# Patient Record
Sex: Female | Born: 1935 | Race: White | Hispanic: No | Smoking: Never smoker
Health system: Southern US, Community
[De-identification: ages and names within clinical notes are randomized; demographics above are authoritative.]

## PROBLEM LIST (undated history)

## (undated) DIAGNOSIS — Z9889 Other specified postprocedural states: Secondary | ICD-10-CM

## (undated) DIAGNOSIS — I10 Essential (primary) hypertension: Secondary | ICD-10-CM

## (undated) DIAGNOSIS — IMO0002 Reserved for concepts with insufficient information to code with codable children: Secondary | ICD-10-CM

## (undated) DIAGNOSIS — C50912 Malignant neoplasm of unspecified site of left female breast: Secondary | ICD-10-CM

## (undated) DIAGNOSIS — M1990 Unspecified osteoarthritis, unspecified site: Secondary | ICD-10-CM

## (undated) DIAGNOSIS — M8580 Other specified disorders of bone density and structure, unspecified site: Secondary | ICD-10-CM

## (undated) DIAGNOSIS — K219 Gastro-esophageal reflux disease without esophagitis: Secondary | ICD-10-CM

## (undated) DIAGNOSIS — IMO0001 Reserved for inherently not codable concepts without codable children: Secondary | ICD-10-CM

## (undated) DIAGNOSIS — F419 Anxiety disorder, unspecified: Secondary | ICD-10-CM

## (undated) DIAGNOSIS — R112 Nausea with vomiting, unspecified: Secondary | ICD-10-CM

## (undated) DIAGNOSIS — C50911 Malignant neoplasm of unspecified site of right female breast: Secondary | ICD-10-CM

## (undated) DIAGNOSIS — E785 Hyperlipidemia, unspecified: Secondary | ICD-10-CM

## (undated) DIAGNOSIS — N189 Chronic kidney disease, unspecified: Secondary | ICD-10-CM

## (undated) HISTORY — PX: ABDOMINAL HYSTERECTOMY: SHX81

## (undated) HISTORY — PX: APPENDECTOMY: SHX54

## (undated) HISTORY — DX: Essential (primary) hypertension: I10

## (undated) HISTORY — DX: Reserved for concepts with insufficient information to code with codable children: IMO0002

## (undated) HISTORY — DX: Hyperlipidemia, unspecified: E78.5

## (undated) HISTORY — DX: Reserved for inherently not codable concepts without codable children: IMO0001

---

## 1998-01-20 ENCOUNTER — Ambulatory Visit (HOSPITAL_COMMUNITY): Admission: RE | Admit: 1998-01-20 | Discharge: 1998-01-20 | Payer: Self-pay | Admitting: Cardiology

## 1998-06-30 ENCOUNTER — Ambulatory Visit (HOSPITAL_COMMUNITY): Admission: RE | Admit: 1998-06-30 | Discharge: 1998-06-30 | Payer: Self-pay | Admitting: *Deleted

## 1998-12-22 ENCOUNTER — Other Ambulatory Visit: Admission: RE | Admit: 1998-12-22 | Discharge: 1998-12-22 | Payer: Self-pay | Admitting: Obstetrics and Gynecology

## 2000-01-11 ENCOUNTER — Other Ambulatory Visit: Admission: RE | Admit: 2000-01-11 | Discharge: 2000-01-11 | Payer: Self-pay | Admitting: Obstetrics and Gynecology

## 2001-09-29 ENCOUNTER — Other Ambulatory Visit: Admission: RE | Admit: 2001-09-29 | Discharge: 2001-09-29 | Payer: Self-pay | Admitting: Obstetrics and Gynecology

## 2002-09-18 ENCOUNTER — Ambulatory Visit (HOSPITAL_COMMUNITY): Admission: RE | Admit: 2002-09-18 | Discharge: 2002-09-18 | Payer: Self-pay | Admitting: *Deleted

## 2003-11-30 ENCOUNTER — Ambulatory Visit (HOSPITAL_COMMUNITY): Admission: RE | Admit: 2003-11-30 | Discharge: 2003-11-30 | Payer: Self-pay | Admitting: Ophthalmology

## 2004-09-15 ENCOUNTER — Encounter (INDEPENDENT_AMBULATORY_CARE_PROVIDER_SITE_OTHER): Payer: Self-pay | Admitting: *Deleted

## 2004-09-15 ENCOUNTER — Ambulatory Visit (HOSPITAL_COMMUNITY): Admission: RE | Admit: 2004-09-15 | Discharge: 2004-09-15 | Payer: Self-pay | Admitting: *Deleted

## 2005-03-12 ENCOUNTER — Encounter: Admission: RE | Admit: 2005-03-12 | Discharge: 2005-03-12 | Payer: Self-pay | Admitting: Internal Medicine

## 2009-09-17 HISTORY — PX: BREAST SURGERY: SHX581

## 2010-03-23 ENCOUNTER — Encounter: Admission: RE | Admit: 2010-03-23 | Discharge: 2010-03-23 | Payer: Self-pay | Admitting: Diagnostic Radiology

## 2010-05-03 ENCOUNTER — Ambulatory Visit (HOSPITAL_COMMUNITY): Admission: RE | Admit: 2010-05-03 | Discharge: 2010-05-03 | Payer: Self-pay | Admitting: General Surgery

## 2010-05-05 ENCOUNTER — Telehealth: Payer: Self-pay

## 2010-05-12 ENCOUNTER — Ambulatory Visit: Payer: Self-pay | Admitting: Oncology

## 2010-05-31 ENCOUNTER — Ambulatory Visit
Admission: RE | Admit: 2010-05-31 | Discharge: 2010-07-13 | Payer: Self-pay | Source: Home / Self Care | Admitting: Radiation Oncology

## 2010-07-11 ENCOUNTER — Ambulatory Visit: Payer: Self-pay | Admitting: Genetic Counselor

## 2010-08-24 ENCOUNTER — Ambulatory Visit: Payer: Self-pay | Admitting: Genetic Counselor

## 2010-08-25 ENCOUNTER — Ambulatory Visit: Payer: Self-pay | Admitting: Oncology

## 2010-08-28 ENCOUNTER — Ambulatory Visit: Payer: Self-pay | Admitting: Cardiology

## 2010-09-20 LAB — CBC WITH DIFFERENTIAL/PLATELET
BASO%: 0.5 % (ref 0.0–2.0)
Basophils Absolute: 0 10*3/uL (ref 0.0–0.1)
EOS%: 1.8 % (ref 0.0–7.0)
Eosinophils Absolute: 0.1 10*3/uL (ref 0.0–0.5)
HCT: 34.7 % — ABNORMAL LOW (ref 34.8–46.6)
HGB: 12.3 g/dL (ref 11.6–15.9)
LYMPH%: 24.4 % (ref 14.0–49.7)
MCH: 34.4 pg — ABNORMAL HIGH (ref 25.1–34.0)
MCHC: 35.5 g/dL (ref 31.5–36.0)
MCV: 96.7 fL (ref 79.5–101.0)
MONO#: 0.5 10*3/uL (ref 0.1–0.9)
MONO%: 7.8 % (ref 0.0–14.0)
NEUT#: 3.9 10*3/uL (ref 1.5–6.5)
NEUT%: 65.5 % (ref 38.4–76.8)
Platelets: 208 10*3/uL (ref 145–400)
RBC: 3.59 10*6/uL — ABNORMAL LOW (ref 3.70–5.45)
RDW: 12.3 % (ref 11.2–14.5)
WBC: 5.9 10*3/uL (ref 3.9–10.3)
lymph#: 1.4 10*3/uL (ref 0.9–3.3)

## 2010-09-20 LAB — COMPREHENSIVE METABOLIC PANEL
ALT: 23 U/L (ref 0–35)
AST: 21 U/L (ref 0–37)
Albumin: 3.9 g/dL (ref 3.5–5.2)
Alkaline Phosphatase: 81 U/L (ref 39–117)
BUN: 15 mg/dL (ref 6–23)
CO2: 29 mEq/L (ref 19–32)
Calcium: 9.9 mg/dL (ref 8.4–10.5)
Chloride: 98 mEq/L (ref 96–112)
Creatinine, Ser: 1.28 mg/dL — ABNORMAL HIGH (ref 0.40–1.20)
Glucose, Bld: 130 mg/dL — ABNORMAL HIGH (ref 70–99)
Potassium: 4.1 mEq/L (ref 3.5–5.3)
Sodium: 136 mEq/L (ref 135–145)
Total Bilirubin: 0.9 mg/dL (ref 0.3–1.2)
Total Protein: 7 g/dL (ref 6.0–8.3)

## 2010-12-01 LAB — COMPREHENSIVE METABOLIC PANEL
AST: 22 U/L (ref 0–37)
Albumin: 4.2 g/dL (ref 3.5–5.2)
BUN: 13 mg/dL (ref 6–23)
Calcium: 9.9 mg/dL (ref 8.4–10.5)
Creatinine, Ser: 1.16 mg/dL (ref 0.4–1.2)
GFR calc Af Amer: 55 mL/min — ABNORMAL LOW (ref >60)
Total Bilirubin: 0.9 mg/dL (ref 0.3–1.2)
Total Protein: 7 g/dL (ref 6.0–8.3)

## 2010-12-01 LAB — DIFFERENTIAL
Basophils Absolute: 0 10*3/uL (ref 0.0–0.1)
Lymphocytes Relative: 37 % (ref 12–46)
Lymphs Abs: 2.4 10*3/uL (ref 0.7–4.0)
Monocytes Absolute: 0.5 10*3/uL (ref 0.1–1.0)
Monocytes Relative: 8 % (ref 3–12)
Neutro Abs: 3.5 10*3/uL (ref 1.7–7.7)

## 2010-12-01 LAB — CBC
MCH: 32.5 pg (ref 26.0–34.0)
MCHC: 34.3 g/dL (ref 30.0–36.0)
MCV: 94.6 fL (ref 78.0–100.0)
Platelets: 188 10*3/uL (ref 150–400)
RDW: 12.2 % (ref 11.5–15.5)

## 2010-12-17 ENCOUNTER — Other Ambulatory Visit: Payer: Self-pay | Admitting: Cardiology

## 2010-12-17 DIAGNOSIS — I10 Essential (primary) hypertension: Secondary | ICD-10-CM

## 2011-02-02 NOTE — Op Note (Signed)
   NAME:  Tammy Oneill, Tammy Oneill                            ACCOUNT NO.:  [id number]   MEDICAL RECORD NO.:  [id number]                   PATIENT TYPE:  AMB   LOCATION:  ENDO                                 FACILITY:  [facility]   PHYSICIAN:  Georgiana Spinner, M.D.                 DATE OF BIRTH:  1932/12/01   DATE OF PROCEDURE:  DATE OF DISCHARGE:                                 OPERATIVE REPORT   PROCEDURE:  Upper endoscopy with dilation.   INDICATIONS FOR PROCEDURE:  Dysphagia.   ANESTHESIA:  Demerol 50, Versed 6 mg.   DESCRIPTION OF PROCEDURE:  With the patient mildly sedated in the left  lateral decubitus position, the Olympus videoscopic endoscope was inserted  in the mouth and passed under direct vision through the esophagus and there  was a question of a stricture. We entered into the stomach. The fundus,  body, antrum, duodenal bulb and second portion of the duodenum all appeared  normal. From this point, the endoscope was slowly withdrawn taking  circumferential views of the entire duodenal mucosa until the endoscope was  then pulled back in the stomach, placed in retroflexion to view the stomach  from below and a hiatal hernia was seen.  The endoscope was then  straightened and withdraw taking circumferential views of the remaining  gastric and esophageal mucosa. Once accomplished, the endoscope was then  reinserted from the proximal esophagus and to the distal stomach. The  guidewire was passed, the endoscope was once again removed and over the  guidewire was passed an 18 Savary dilator with minimal resistance. No blood  was seen on the dilator. The endoscope was then reinserted after the  guidewire was removed and advanced into the stomach and then withdrawn  taking circumferential views of the remaining gastric and esophageal mucosa.  The patient's vital signs and pulse oximeter remained stable. The patient  tolerated the procedure well without apparent complications.   FINDINGS:   Question of esophageal stricture above a hiatal hernia dilated to  18 Savary.   PLAN:  Await clinical response and will have the patient followup with me as  an outpatient.                                                Georgiana Spinner, M.D.    GMO/MEDQ  D:  09/18/2002  T:  09/18/2002  Job:  [id number]

## 2011-02-02 NOTE — Op Note (Signed)
NAME:  Tammy Oneill, Tammy Oneill                ACCOUNT NO.:  [id number]   MEDICAL RECORD NO.:  [id number]          PATIENT TYPE:  AMB   LOCATION:  ENDO                         FACILITY:  [facility]   PHYSICIAN:  Georgiana Spinner, M.D.    DATE OF BIRTH:  June 20, 1933   DATE OF PROCEDURE:  09/15/2004  DATE OF DISCHARGE:                                 OPERATIVE REPORT   PROCEDURE:  Colonoscopy with biopsy.   INDICATIONS FOR PROCEDURE:  Colon polyps.   ANESTHESIA:  Demerol 80, Versed 8 mg.   DESCRIPTION OF PROCEDURE:  With the patient mildly sedated in the left  lateral decubitus position, the Olympus videoscopic colonoscope was inserted  in the rectum and passed under direct vision to the cecum identified by base  of cecum and ileocecal valve both of which were photographed. From this  point, the colonoscope was slowly withdrawn taking circumferential views of  the colonic mucosa stopping only in the ascending colon just proximal to the  hepatic flexure where a polyp was seen, photographed and removed using hot  biopsy forceps technique on a setting of 20/20 blended current until we  reached the rectum which appeared normal on direct and retroflexed view. The  endoscope was straightened and withdrawn. The patient's vital signs and  pulse oximeter remained stable. The patient tolerated the procedure well  without apparent complications.   FINDINGS:  Occasional diverticulum of the sigmoid colon, small polyp of  ascending colon just proximal to the hepatic flexure otherwise an  unremarkable examination.   PLAN:  Await biopsy report. The patient will call me for results and  followup with me as an outpatient.      GMO/MEDQ  D:  09/15/2004  T:  09/15/2004  Job:  [id number]

## 2011-02-02 NOTE — Op Note (Signed)
NAMEMarland Kitchen  Tammy Oneill, Tammy Oneill                            ACCOUNT NO.:  [id number]   MEDICAL RECORD NO.:  [id number]                   PATIENT TYPE:  OIB   LOCATION:  2864                                 FACILITY:  [facility]   PHYSICIAN:  Robert L. Dione Booze, M.D.               DATE OF BIRTH:  1933/09/11   DATE OF PROCEDURE:  11/30/2003  DATE OF DISCHARGE:  11/30/2003                                 OPERATIVE REPORT   PREOPERATIVE DIAGNOSIS:  Dermatochalasis of the skin of the upper eyelids  with visual impairment.   POSTOPERATIVE DIAGNOSIS:  Dermatochalasis of the skin of the upper eyelids  with visual impairment.   OPERATION PERFORMED:  Upper eyelid blepharoplasty.   SURGEON:  Robert L. Dione Booze, M.D.   ANESTHESIA:  1% Xylocaine with epinephrine.   INDICATIONS AND JUSTIFICATIONS FOR THE PROCEDURE:  This 77 year old lady was  seen most recently in my office on September 09, 2003, to discuss upper  eyelid optical blepharoplasty.  Previous examination showed the pressure was  19 in each eye.  The pupils, motility, conjunctiva, cornea, anterior  chamber, and dilated fundus exam were normal, and she does have early  cataracts but her vision is 20/30.  Externally she has a large amount of  redundant skin of each upper eyelid such that the margin-reflex distance is  about 2 mm bilaterally and the skin covers the lashes and actually blocks  the upper 40% of her visual field.  These findings were confirmed with  visual field testing and with photographs.  She is quite bothered and  reports a chronic dermatitis, and she can feel the weight of the skin and  can see the skin to the side, and this annoys her.  She is having upper  eyelid blepharoplasties because of these symptoms and not because of  cosmetic reasons.  Medically she should be stable for this, and she is  followed medically by Dr. Nicholos Johns and by Cassell Clement, M.D.   JUSTIFICATION FOR PERFORMING THE PROCEDURE IN OUTPATIENT SETTING:   Routine.   JUSTIFICATION FOR OVERNIGHT STAY:  None.   DESCRIPTION OF PROCEDURE:  The patient arrived in the operating room and was  prepped and draped in the routine fashion.  Xylocaine 1% with epinephrine  was given to the skin of each upper eyelid and the skin to be removed was  carefully demarcated and excised along with underlying fatty tissue.  Bleeding was controlled with cautery and pressure.  Each wound was closed  with a running 6-0 nylon suture and ice packs were used.  The patient left  the minor room having done well.   FOLLOW-UP CARE:  The patient will be seen in my office in six days to have  the sutures removed.  She is to use cool compresses today and is to use warm  compresses after that.  She is to come in and call if there is any bleeding.  Robert L. Dione Booze, M.D.    RLG/MEDQ  D:  11/30/2003  T:  12/01/2003  Job:  [id number]   cc:   Georgianne Fick, M.D.  [address]  [place]  [place] [address]  Fax: [phone]   Cassell Clement, M.D.  [address]  [place]  [place] [address]  Fax: [phone]

## 2011-02-19 ENCOUNTER — Other Ambulatory Visit: Payer: Self-pay | Admitting: Cardiology

## 2011-02-19 DIAGNOSIS — K219 Gastro-esophageal reflux disease without esophagitis: Secondary | ICD-10-CM

## 2011-02-19 MED ORDER — OMEPRAZOLE 20 MG PO CPDR: 20.0000 mg | DELAYED_RELEASE_CAPSULE | Freq: Two times a day (BID) | ORAL | Status: DC

## 2011-02-19 NOTE — Telephone Encounter (Signed)
Called in needing a refill of Prilosec OTC (which she receives for free) faxed into [facility] at [phone].

## 2011-02-19 NOTE — Telephone Encounter (Signed)
Faxed at patient request

## 2011-02-26 ENCOUNTER — Ambulatory Visit
Admission: RE | Admit: 2011-02-26 | Discharge: 2011-02-26 | Disposition: A | Discharge status: Home / Self Care | Payer: Medicare Other | Source: Ambulatory Visit | Attending: Radiation Oncology | Admitting: Radiation Oncology

## 2011-02-26 ENCOUNTER — Encounter (HOSPITAL_BASED_OUTPATIENT_CLINIC_OR_DEPARTMENT_OTHER): Payer: Medicare Other | Admitting: Oncology

## 2011-02-26 ENCOUNTER — Other Ambulatory Visit: Payer: Self-pay | Admitting: Oncology

## 2011-02-26 DIAGNOSIS — C50419 Malignant neoplasm of upper-outer quadrant of unspecified female breast: Secondary | ICD-10-CM

## 2011-02-26 DIAGNOSIS — M1990 Unspecified osteoarthritis, unspecified site: Secondary | ICD-10-CM

## 2011-02-26 DIAGNOSIS — M899 Disorder of bone, unspecified: Secondary | ICD-10-CM

## 2011-02-26 DIAGNOSIS — Z8541 Personal history of malignant neoplasm of cervix uteri: Secondary | ICD-10-CM

## 2011-02-26 LAB — CBC WITH DIFFERENTIAL/PLATELET
Basophils Absolute: 0 10*3/uL (ref 0.0–0.1)
Eosinophils Absolute: 0.1 10*3/uL (ref 0.0–0.5)
HGB: 11.6 g/dL (ref 11.6–15.9)
LYMPH%: 37.8 % (ref 14.0–49.7)
MONO#: 0.3 10*3/uL (ref 0.1–0.9)
NEUT#: 2.1 10*3/uL (ref 1.5–6.5)
Platelets: 148 10*3/uL (ref 145–400)
RBC: 3.57 10*6/uL — ABNORMAL LOW (ref 3.70–5.45)
WBC: 4.2 10*3/uL (ref 3.9–10.3)
nRBC: 0 % (ref 0–0)

## 2011-02-26 LAB — COMPREHENSIVE METABOLIC PANEL
ALT: 11 U/L (ref 0–35)
CO2: 24 mEq/L (ref 19–32)
Calcium: 9.6 mg/dL (ref 8.4–10.5)
Chloride: 104 mEq/L (ref 96–112)
Creatinine, Ser: 1.38 mg/dL — ABNORMAL HIGH (ref 0.50–1.10)
Sodium: 138 mEq/L (ref 135–145)
Total Protein: 6.6 g/dL (ref 6.0–8.3)

## 2011-02-26 LAB — CANCER ANTIGEN 27.29: CA 27.29: 23 U/mL (ref 0–39)

## 2011-03-06 ENCOUNTER — Encounter (HOSPITAL_BASED_OUTPATIENT_CLINIC_OR_DEPARTMENT_OTHER): Payer: Medicare Other | Admitting: Oncology

## 2011-03-06 DIAGNOSIS — C50419 Malignant neoplasm of upper-outer quadrant of unspecified female breast: Secondary | ICD-10-CM

## 2011-03-06 DIAGNOSIS — Z170 Estrogen receptor positive status [ER+]: Secondary | ICD-10-CM

## 2011-03-12 ENCOUNTER — Encounter: Payer: Self-pay | Admitting: Cardiology

## 2011-05-14 ENCOUNTER — Telehealth: Payer: Self-pay | Admitting: Cardiology

## 2011-05-14 NOTE — Telephone Encounter (Signed)
Said she was having hard time finding out if [facility] sent her micardis yet.  Advised to continue to try to get in touch with them and gathered samples for her to pick up until she receives.

## 2011-05-14 NOTE — Telephone Encounter (Signed)
Patient  Is having trouble refilling her HCTZ through [facility]. She said that she does not have a Rx number.

## 2011-05-18 ENCOUNTER — Other Ambulatory Visit: Payer: Self-pay | Admitting: Cardiology

## 2011-05-18 DIAGNOSIS — E785 Hyperlipidemia, unspecified: Secondary | ICD-10-CM

## 2011-05-23 NOTE — Telephone Encounter (Signed)
Refilled crestor 

## 2011-05-30 ENCOUNTER — Telehealth: Payer: Self-pay | Admitting: Cardiology

## 2011-05-30 NOTE — Telephone Encounter (Signed)
Needs new pres for HCT Hyzaar called into [facility] She is almost out of samples.  [phone]. She states she has called 3-4 times trying to get this done.  She is suppose to take a whole pill but has been cut down to 1/2 to make them last longer.  Please call her in a one month supply to [facility] on [address] until she receives from [employer].

## 2011-05-30 NOTE — Telephone Encounter (Signed)
Called [facility] and for some reason rx was not sent.  They are sending now and does not need for [place] to call in

## 2011-06-09 IMAGING — CR DG CHEST 2V
2 series · 2 of 2 positions shown · non-contrast
Comparison: None.

CLINICAL DATA: Preop.

CHEST - 2 VIEW

[view not recorded (1 of 2)]
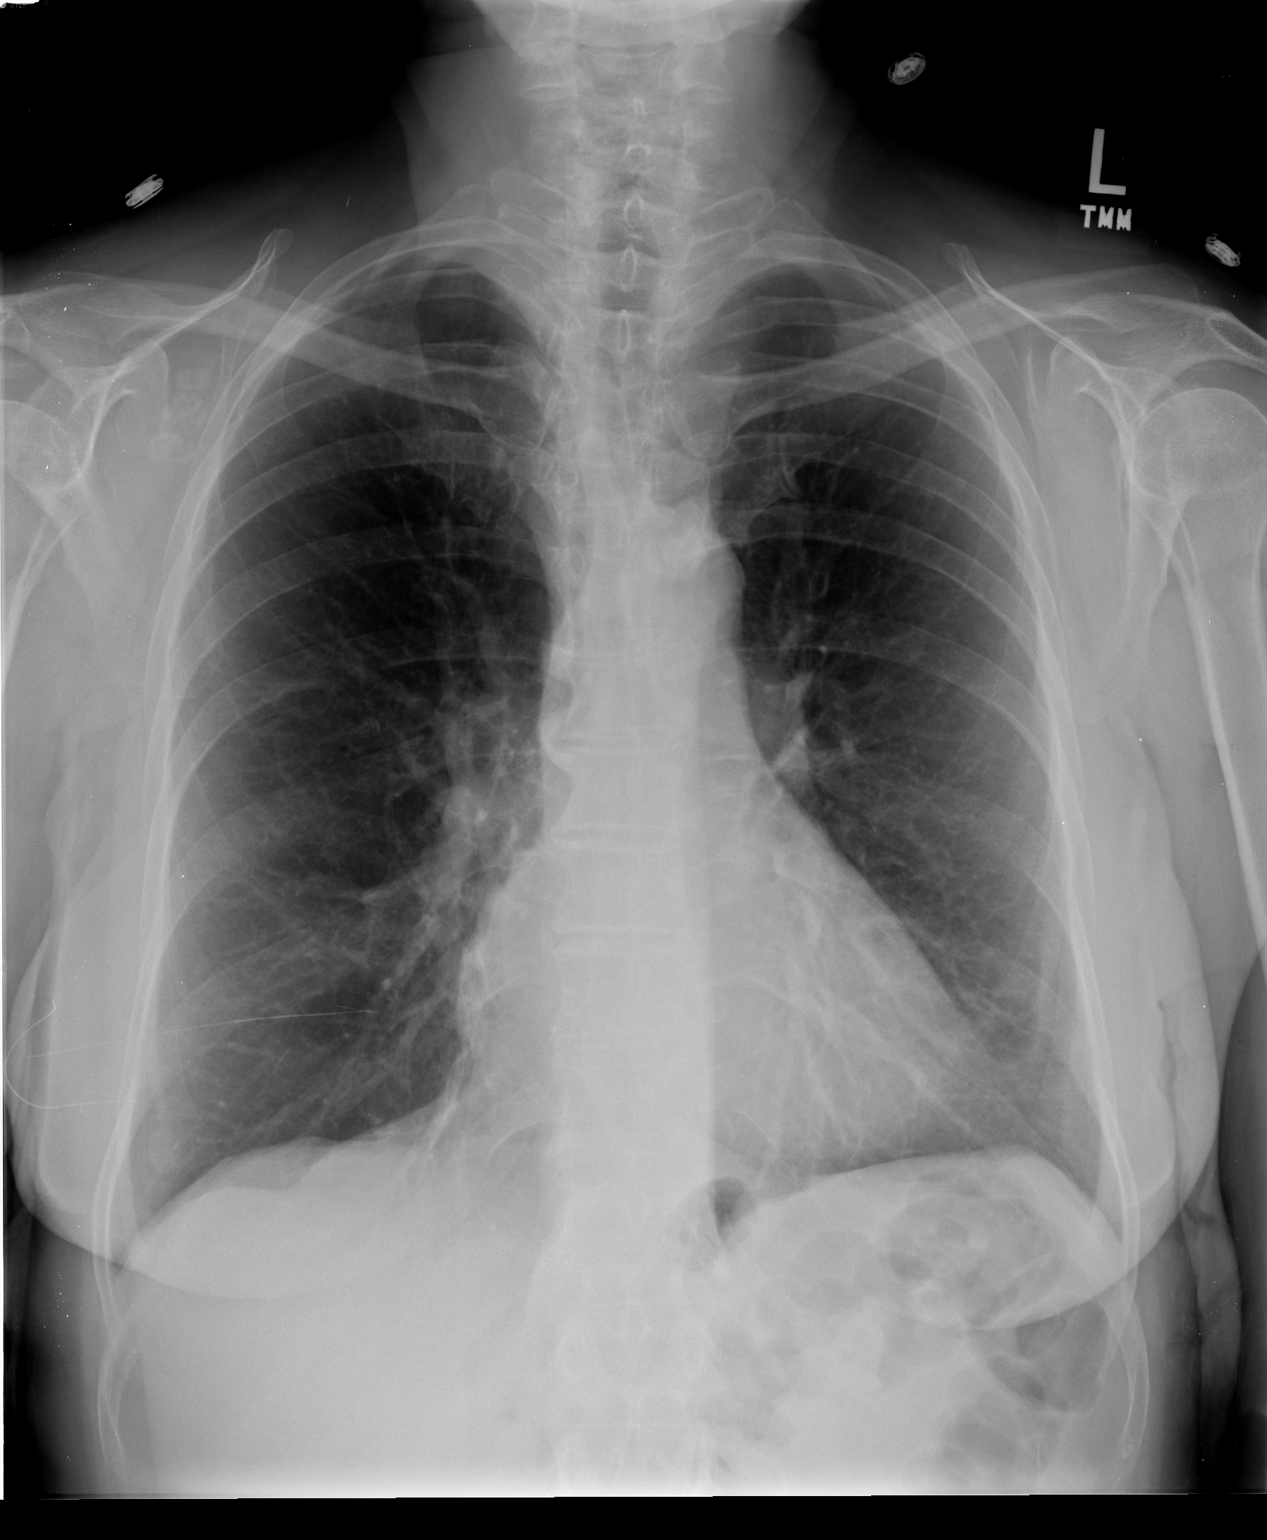

[view not recorded (2 of 2)]
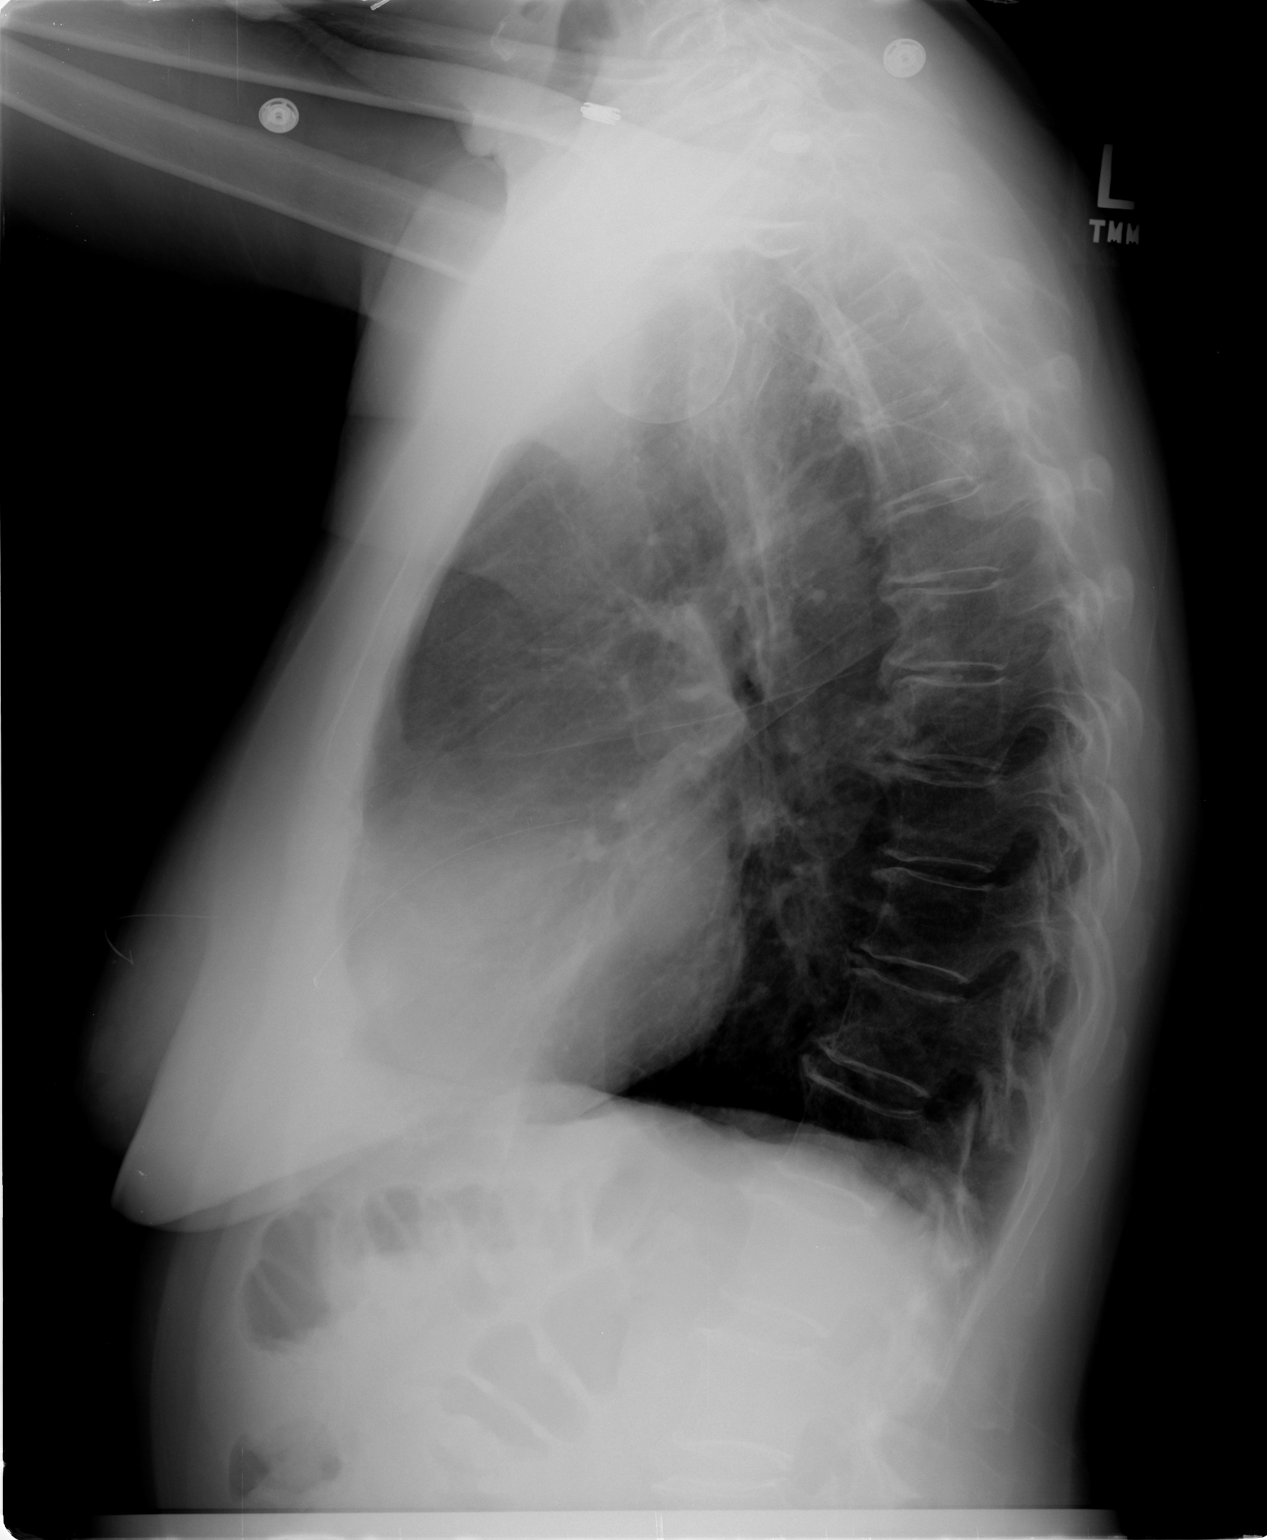

[2 of 2 positions shown; findings below may reference images not displayed]

FINDINGS: Trachea is midline.  Heart size normal.  A nipple shadow
projects over the right costophrenic angle.  Right apical pleural
thickening.  Scar subsegmental atelectasis in the right middle
lobe.  Lungs are otherwise clear.  No pleural fluid.
IMPRESSION: No acute findings.

## 2011-06-21 ENCOUNTER — Encounter: Payer: Self-pay | Admitting: Cardiology

## 2011-06-21 ENCOUNTER — Ambulatory Visit (INDEPENDENT_AMBULATORY_CARE_PROVIDER_SITE_OTHER): Payer: Medicare Other | Admitting: Cardiology

## 2011-06-21 VITALS — BP 138/78 | HR 60 | Ht 62.0 in | Wt 150.0 lb

## 2011-06-21 DIAGNOSIS — C50919 Malignant neoplasm of unspecified site of unspecified female breast: Secondary | ICD-10-CM

## 2011-06-21 DIAGNOSIS — C50911 Malignant neoplasm of unspecified site of right female breast: Secondary | ICD-10-CM | POA: Insufficient documentation

## 2011-06-21 DIAGNOSIS — I119 Hypertensive heart disease without heart failure: Secondary | ICD-10-CM

## 2011-06-21 DIAGNOSIS — E7800 Pure hypercholesterolemia, unspecified: Secondary | ICD-10-CM | POA: Insufficient documentation

## 2011-06-21 LAB — LDL CHOLESTEROL, DIRECT: Direct LDL: 125.8 mg/dL

## 2011-06-21 LAB — BASIC METABOLIC PANEL
BUN: 21 mg/dL (ref 6–23)
Calcium: 9.4 mg/dL (ref 8.4–10.5)
Creatinine, Ser: 1.2 mg/dL (ref 0.4–1.2)
GFR: 45.48 mL/min — ABNORMAL LOW (ref >60.00)

## 2011-06-21 LAB — HEPATIC FUNCTION PANEL
ALT: 18 U/L (ref 0–35)
AST: 23 U/L (ref 0–37)
Alkaline Phosphatase: 76 U/L (ref 39–117)
Bilirubin, Direct: 0.1 mg/dL (ref 0.0–0.3)
Total Protein: 7.5 g/dL (ref 6.0–8.3)

## 2011-06-21 NOTE — Progress Notes (Signed)
Tammy Oneill Date of Birth:  1933/07/24 [facility] [place] [address]   [address] [place], [place]  [address] [phone]           Fax   [phone]  HPI: This pleasant 77 year old woman is seen for a scheduled six-month followup office visit.  History of essential hypertension.  She also has a history of hypercholesterolemia.  She has a history of breast cancer of the right breast and has had 2 lumps removed, followed by a course of radiation.  She did not have to go on chemotherapy.  Current Outpatient Prescriptions  Medication Sig Dispense Refill  . CRESTOR 5 MG tablet TAKE 1 TABLET MONDAY, WEDNESDAY, AND FRIDAY  39 tablet  3  . metoprolol (TOPROL-XL) 50 MG 24 hr tablet Take 50 mg by mouth as directed. 1/2 tablet daily       . MICARDIS HCT 80-12.5 MG per tablet TAKE 1 TABLET DAILY  90 tablet  3  . omeprazole (PRILOSEC) 20 MG capsule Take 1 capsule (20 mg total) by mouth 2 (two) times daily.  180 capsule  3  . Risedronate Sodium (ACTONEL PO) Take by mouth. weekly         Allergies  Allergen Reactions  . Morphine And Related     vomiting    Patient Active Problem List  Diagnoses  . Pure hypercholesterolemia  . Benign hypertensive heart disease without heart failure  . Breast cancer    History  Smoking status  . Never Smoker   Smokeless tobacco  . Not on file    History  Alcohol Use: Not on file    No family history on file.  Review of Systems: The patient denies any heat or cold intolerance.  No weight gain or weight loss.  The patient denies headaches or blurry vision.  There is no cough or sputum production.  The patient denies dizziness.  There is no hematuria or hematochezia.  The patient denies any muscle aches or arthritis.  The patient denies any rash.  The patient denies frequent falling or instability.  There is no history of depression or anxiety.  All other systems were reviewed and are negative.   Physical Exam: Filed  Vitals:   06/21/11 1017  BP: 138/78  Pulse: 60   Gen. appearance fairly well-developed, well-nourished woman in no distressThe head and neck exam reveals pupils equal and reactive.  Extraocular movements are full.  There is no scleral icterus.  The mouth and pharynx are normal.  The neck is supple.  The carotids reveal no bruits.  The jugular venous pressure is normal.  The  thyroid is not enlarged.  There is no lymphadenopathy.  The chest is clear to percussion and auscultation.  There are no rales or rhonchi.  Expansion of the chest is symmetrical.  The precordium is quiet.  The first heart sound is normal.  The second heart sound is physiologically split.  There is no murmur gallop rub or click.  There is no abnormal lift or heave.  The abdomen is soft and nontender.  The bowel sounds are normal.  The liver and spleen are not enlarged.  There are no abdominal masses.  There are no abdominal bruits.  Extremities reveal good pedal pulses.  There is no phlebitis or edema.  There is no cyanosis or clubbing.  Strength is normal and symmetrical in all extremities.  There is no lateralizing weakness.  There are no sensory deficits.  The skin is  warm and dry.  There is no rash.     Assessment / Plan:  Continue same medication.  Lose weight.  Recheck 6 months

## 2011-06-21 NOTE — Patient Instructions (Signed)
continue same dose of medications  Your physician wants you to follow-up in: 6 month You will receive a reminder letter in the mail two months in advance. If you don't receive a letter, please call our office to schedule the follow-up appointment.

## 2011-06-21 NOTE — Assessment & Plan Note (Signed)
We're checking blood work on her today to see where we stand with her cholesterol

## 2011-06-21 NOTE — Assessment & Plan Note (Signed)
The patient has not been expressing any chest pain or shortness of breath.  She has gained weight.  She has not been getting much exercise over the course of the summer.

## 2011-06-29 ENCOUNTER — Telehealth: Payer: Self-pay | Admitting: *Deleted

## 2011-06-29 NOTE — Telephone Encounter (Signed)
Advised of labs 

## 2011-06-29 NOTE — Progress Notes (Signed)
Advised 

## 2011-06-29 NOTE — Telephone Encounter (Signed)
Message copied by Burnell Blanks on Fri Jun 29, 2011  5:48 PM ------      Message from: Cassell Clement      Created: Fri Jun 22, 2011  5:58 PM       Please report.  The cholesterol is 219, and the triglycerides are slightly elevated in the 158.  The liver tests are normal.  The blood sugar is high at 119, and she needs to watch carbohydrates carefully.  Continue careful diet and same medication

## 2011-07-23 ENCOUNTER — Other Ambulatory Visit: Payer: Self-pay | Admitting: Cardiology

## 2011-07-23 NOTE — Telephone Encounter (Signed)
Refilled actonel

## 2011-08-18 ENCOUNTER — Telehealth: Payer: Self-pay | Admitting: Oncology

## 2011-08-18 NOTE — Telephone Encounter (Signed)
per pof 06/19 called pt and scheduled her appts for jan2013

## 2011-09-19 ENCOUNTER — Other Ambulatory Visit: Payer: Self-pay | Admitting: Oncology

## 2011-09-19 ENCOUNTER — Other Ambulatory Visit (HOSPITAL_BASED_OUTPATIENT_CLINIC_OR_DEPARTMENT_OTHER): Payer: Medicare Other | Admitting: Lab

## 2011-09-19 DIAGNOSIS — Z8541 Personal history of malignant neoplasm of cervix uteri: Secondary | ICD-10-CM | POA: Diagnosis not present

## 2011-09-19 DIAGNOSIS — C50419 Malignant neoplasm of upper-outer quadrant of unspecified female breast: Secondary | ICD-10-CM | POA: Diagnosis not present

## 2011-09-19 DIAGNOSIS — M949 Disorder of cartilage, unspecified: Secondary | ICD-10-CM | POA: Diagnosis not present

## 2011-09-19 DIAGNOSIS — M1990 Unspecified osteoarthritis, unspecified site: Secondary | ICD-10-CM | POA: Diagnosis not present

## 2011-09-19 LAB — COMPREHENSIVE METABOLIC PANEL
ALT: 21 U/L (ref 0–35)
AST: 22 U/L (ref 0–37)
Calcium: 9.3 mg/dL (ref 8.4–10.5)
Chloride: 104 mEq/L (ref 96–112)
Creatinine, Ser: 1.18 mg/dL — ABNORMAL HIGH (ref 0.50–1.10)

## 2011-09-19 LAB — CBC WITH DIFFERENTIAL/PLATELET
BASO%: 0.6 % (ref 0.0–2.0)
EOS%: 2.7 % (ref 0.0–7.0)
HCT: 34 % — ABNORMAL LOW (ref 34.8–46.6)
MCH: 33.3 pg (ref 25.1–34.0)
MCHC: 34.7 g/dL (ref 31.5–36.0)
NEUT%: 53.3 % (ref 38.4–76.8)
RDW: 12.4 % (ref 11.2–14.5)
lymph#: 1.3 10*3/uL (ref 0.9–3.3)

## 2011-09-26 ENCOUNTER — Encounter: Payer: Self-pay | Admitting: Physician Assistant

## 2011-09-26 ENCOUNTER — Ambulatory Visit (HOSPITAL_BASED_OUTPATIENT_CLINIC_OR_DEPARTMENT_OTHER): Payer: Medicare Other | Admitting: Physician Assistant

## 2011-09-26 VITALS — BP 136/64 | HR 61 | Temp 97.5°F | Ht 62.0 in | Wt 143.4 lb

## 2011-09-26 DIAGNOSIS — Z923 Personal history of irradiation: Secondary | ICD-10-CM | POA: Diagnosis not present

## 2011-09-26 DIAGNOSIS — Z853 Personal history of malignant neoplasm of breast: Secondary | ICD-10-CM

## 2011-09-26 DIAGNOSIS — C50919 Malignant neoplasm of unspecified site of unspecified female breast: Secondary | ICD-10-CM

## 2011-09-26 MED ORDER — INFLUENZA VIRUS VACC SPLIT PF IM SUSP
0.5000 mL | INTRAMUSCULAR | Status: AC | PRN
  Administered 2011-09-26: 0.5 mL via INTRAMUSCULAR

## 2011-09-26 NOTE — Progress Notes (Signed)
Hematology and Oncology Follow Up Visit  Tammy Oneill [id number] 1932/09/23 78 y.o. 09/26/2011    HPI: Tammy Oneill was feeling fine, and had no particular symptoms when she had routine mammographic screening at [facility] March 02, 2010.  Her prior mammogram had been in 2008.  This time Dr. Yolanda Bonine noted an irregular area of focal asymmetry in the upper outer quadrant of the right breast, and the patient was brought back for a diagnostic mammography and right breast ultrasonography March 13, 2010.  This confirmed an irregular ill defined, mixed density/hyperdense mass in the right breast with some spiculation and distortion, which by ultrasound measured 1.2 cm.  There was also a smaller, though otherwise similar lesion more posteriorly, and that one measured 8 mm by ultrasound.  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  Both were estrogen and progesterone receptor positive and HER2-, and both had a low proliferation fraction, although morphologically there was some difference between the masses, so that at the Multidisciplinary Breast Cancer Conference this morning, it was felt most likely this were two concurrent primaries.  With this information the patient was referred to Dr. Dwain Sarna, and bilateral breast MRIs were obtained January 7th.  This showed the larger mass to be 1.4 cm, the smaller one to measure 1.1 cm, but there was no other finding of significance in either breast, and there was no evidence of adenopathy.   Accordingly, the patient proceeded to definitive lumpectomies, which were performed August 17.  The final pathology from this procedure ([id number]) showed two invasive ductal carcinomas, the larger measuring 1.4 cm, the smaller 7 mm, again both ER/PR+, and HER2-.  Both were grade 1.  Margins were close, although negative, and there was no evidence of lymphovascular invasion.  Zero of two  sentinel lymph nodes were involved.  The patient was staged at T1c N0, or stage I.    Patient received radiation therapy, completed October of 2011. She declined adjuvant antiestrogen this, and is followed with observation alone.  Tammy Oneill is known to be BRCA1 and 2 negative.  Interim History:   Tammy Oneill returns today for routine six-month followup of her right breast carcinoma. Recall that she declined adjuvant antiestrogen as, and is followed now with observation alone.  Interval history is generally unremarkable. 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 She has mild fatigue. She notes an occasional ringing in her ears. She has some reflux which occasionally causes some discomfort with swallowing. She has poor circulation. She denies chest pain or palpitations. She recently had a "cold" and has a residual dry cough. No shortness of breath. No fevers, chills, or night sweats. She has occasional anxiety, but denies depression or suicidal ideations. She also has some pain primarily in the right shoulder and right hip which is been diagnosed as arthritis. She has seen Dr. Darrelyn Hillock for this complaint.  A detailed review of systems is otherwise noncontributory as noted below.  Review of Systems: Constitutional:  no weight loss, fever, night sweats and feels well Eyes: uses glasses KGM:[id number] Cardiovascular: no chest pain or dyspnea on exertion Respiratory: positive for - cough negative for - hemoptysis, orthopnea, pleuritic pain or shortness of breath Neurological: no TIA or stroke symptoms negative Dermatological: negative Gastrointestinal: positive for - heartburn and swallowing difficulty/pain negative for - abdominal pain, blood in stools, change in bowel habits or nausea/vomiting Genito-Urinary: no dysuria, trouble voiding, or  hematuria Hematological and Lymphatic: negative Breast: negative Musculoskeletal: positive for - joint pain Remaining ROS  negative.  FAMILY HISTORY: The patient's mother died with bladder cancer at the age of 76.  The patient's father died with lung cancer at the age of 61.  She had one brother who died in an automobile accident.  One sister is "okay".  There is no breast or ovarian cancer in the family except for the patient's older daughter, Lavenia Atlas, who lives in [place], and was diagnosed with breast cancer at the age of 26. She was treated at [facility].   GYNECOLOGIC HISTORY:  GX P5.  First pregnancy to term at age 45.  Hysterectomy at age 59.  She took Premarin for many years, stopping about ten years ago.   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  She has been divorced twice, lives home alone, has no pets.  Son, Dorene Sorrow, 59, works in a Radiation protection practitioner company here in [place], and does son, Aileen Pilot, [place].  Daughter, Lavenia Atlas in [place], age 89, is the daughter who had the breast cancer.  Daughter, Loretta Plume, works as [employer] here in [place], she is 21, and daughter, Angelica Pou, works in Consulting civil engineer in [place], [place].  The patient has nine grandchildren and three great-grandchildren.  She attends [employer].    Medications:   I have reviewed the patient's current medications.  Current Outpatient Prescriptions  Medication Sig Dispense Refill  . ACTONEL 35 MG tablet TAKE AS DIRECTED  3 tablet  3  . aspirin 81 MG tablet Take 160 mg by mouth daily.      . CRESTOR 5 MG tablet TAKE 1 TABLET MONDAY, WEDNESDAY, AND FRIDAY  39 tablet  3  . metoprolol (TOPROL-XL) 50 MG 24 hr tablet Take 50 mg by mouth as directed. 1/2 tablet daily       . MICARDIS HCT 80-12.5 MG per tablet TAKE 1 TABLET DAILY  90 tablet  3  . omeprazole (PRILOSEC) 20 MG capsule Take 1 capsule (20 mg total) by mouth 2 (two) times daily.  180 capsule  3   Current Facility-Administered Medications   Medication Dose Route Frequency Provider Last Rate Last Dose  . influenza  inactive virus vaccine (FLUZONE/FLUARIX) injection 0.5 mL  0.5 mL Intramuscular Prior to discharge Zollie Scale, PA        Allergies:  Allergies  Allergen Reactions  . Morphine And Related     vomiting    Physical Exam: Filed Vitals:   09/26/11 1046  BP: 136/64  Pulse: 61  Temp: 97.5 F (36.4 C)   HEENT:  Sclerae anicteric, conjunctivae pink.  Oropharynx clear.  No mucositis or candidiasis.   Nodes:  No cervical, supraclavicular, or axillary lymphadenopathy palpated.  Breast Exam:  Right breast, status post lumpectomy. Well-healed incision.Nodularities or skin changes. No evidence of local recurrence. Left breast is benign, no masses, discharge, skin change, or nipple inversion.   Lungs:  Clear to auscultation bilaterally.  No crackles, rhonchi, or wheezes.   Heart:  Regular rate and rhythm.   Abdomen:  Soft, nontender.  Positive bowel sounds.  No organomegaly or masses palpated.   Musculoskeletal:  No focal spinal tenderness to palpation.  Extremities:  Benign.  No peripheral edema or cyanosis.   Skin:  Benign.   Neuro:  Nonfocal.   Lab Results: Lab Results  Component Value Date   WBC 3.8* 09/19/2011   HGB 11.8 09/19/2011   HCT 34.0* 09/19/2011   MCV 96.0 09/19/2011   PLT 198 09/19/2011   NEUTROABS 2.0 09/19/2011     Chemistry      Component Value Date/Time   NA 140 09/19/2011 0902   NA 140 09/19/2011 0902   K 4.3 09/19/2011 0902   K 4.3 09/19/2011 0902   CL 104 09/19/2011 0902   CL 104 09/19/2011 0902   CO2 26 09/19/2011 0902   CO2 26 09/19/2011 0902   BUN 16 09/19/2011 0902   BUN 16 09/19/2011 0902   CREATININE 1.18* 09/19/2011 0902   CREATININE 1.18* 09/19/2011 0902      Component Value Date/Time   CALCIUM 9.3 09/19/2011 0902   CALCIUM 9.3 09/19/2011 0902   ALKPHOS 84 09/19/2011 0902   ALKPHOS 84 09/19/2011 0902   AST 22 09/19/2011 0902   AST 22 09/19/2011 0902   ALT 21 09/19/2011 0902   ALT 21 09/19/2011 0902   BILITOT 0.5  09/19/2011 0902   BILITOT 0.5 09/19/2011 0902        Radiological Studies:  Most recent bilateral diagnostic mammogram on 03/07/2011 at [facility] was unremarkable.   Assessment:  78 year old [place] woman   1.  Status post double right-sided lumpectomies August 2011 for a 1.4 and 0.7-cm lesions, both strongly estrogen and progesterone receptor positive, HER2/neu negative, with low proliferation fractions.  Both sentinel lymph nodes were negative and so the patient stages as T1c N0, grade 1.    2.  She completed radiation therapy October 2011, declining adjuvant antiestrogens and followed with observation alone.    3.  She is known BRCA 1 and 2 negative.   Plan:  With regards to her breast cancer, Kenadi is doing extremely well, and there is no clinical evidence of disease recurrence. She will have her mammogram repeated in June, and return to see [place] in 6 months, July 2013. At that point she is still doing well, we will likely see her on an annual basis.  Starlette had not yet had her flu shot and this was administered during her visit.   This plan was reviewed with the patient, who voices understanding and agreement.  She knows to call with any changes or problems.    Rayden Scheper, PA-C 09/26/2011

## 2011-11-11 ENCOUNTER — Other Ambulatory Visit: Payer: Self-pay | Admitting: Cardiology

## 2011-11-12 NOTE — Telephone Encounter (Signed)
Refilled metoprolol 

## 2011-12-27 DIAGNOSIS — Z0000 Encounter for general adult medical examination without abnormal findings: Secondary | ICD-10-CM | POA: Diagnosis not present

## 2011-12-27 DIAGNOSIS — R5383 Other fatigue: Secondary | ICD-10-CM | POA: Diagnosis not present

## 2011-12-27 DIAGNOSIS — Z79899 Other long term (current) drug therapy: Secondary | ICD-10-CM | POA: Diagnosis not present

## 2011-12-27 DIAGNOSIS — E782 Mixed hyperlipidemia: Secondary | ICD-10-CM | POA: Diagnosis not present

## 2011-12-27 DIAGNOSIS — I10 Essential (primary) hypertension: Secondary | ICD-10-CM | POA: Diagnosis not present

## 2012-01-03 ENCOUNTER — Other Ambulatory Visit: Payer: Medicare Other

## 2012-01-03 ENCOUNTER — Ambulatory Visit: Payer: Medicare Other | Admitting: Cardiology

## 2012-01-03 DIAGNOSIS — Z23 Encounter for immunization: Secondary | ICD-10-CM | POA: Diagnosis not present

## 2012-01-03 DIAGNOSIS — M810 Age-related osteoporosis without current pathological fracture: Secondary | ICD-10-CM | POA: Diagnosis not present

## 2012-01-03 DIAGNOSIS — R933 Abnormal findings on diagnostic imaging of other parts of digestive tract: Secondary | ICD-10-CM | POA: Diagnosis not present

## 2012-01-03 DIAGNOSIS — I10 Essential (primary) hypertension: Secondary | ICD-10-CM | POA: Diagnosis not present

## 2012-01-03 DIAGNOSIS — E782 Mixed hyperlipidemia: Secondary | ICD-10-CM | POA: Diagnosis not present

## 2012-01-03 DIAGNOSIS — H908 Mixed conductive and sensorineural hearing loss, unspecified: Secondary | ICD-10-CM | POA: Diagnosis not present

## 2012-02-08 ENCOUNTER — Other Ambulatory Visit: Payer: Self-pay | Admitting: Cardiology

## 2012-02-08 NOTE — Telephone Encounter (Signed)
..   Requested Prescriptions   Pending Prescriptions Disp Refills  . MICARDIS HCT 80-12.5 MG per tablet [Pharmacy Med Name: MICARDIS HCT TABS 80/12.5] 90 tablet 0    Sig: TAKE 1 TABLET DAILY

## 2012-02-14 ENCOUNTER — Ambulatory Visit (INDEPENDENT_AMBULATORY_CARE_PROVIDER_SITE_OTHER): Payer: Medicare Other | Admitting: *Deleted

## 2012-02-14 ENCOUNTER — Encounter: Payer: Self-pay | Admitting: Cardiology

## 2012-02-14 ENCOUNTER — Ambulatory Visit (INDEPENDENT_AMBULATORY_CARE_PROVIDER_SITE_OTHER): Payer: Medicare Other | Admitting: Cardiology

## 2012-02-14 VITALS — BP 122/62 | HR 70 | Resp 17 | Ht 62.0 in | Wt 144.0 lb

## 2012-02-14 DIAGNOSIS — I119 Hypertensive heart disease without heart failure: Secondary | ICD-10-CM | POA: Diagnosis not present

## 2012-02-14 DIAGNOSIS — E7800 Pure hypercholesterolemia, unspecified: Secondary | ICD-10-CM

## 2012-02-14 LAB — LIPID PANEL
Cholesterol: 186 mg/dL (ref 0–200)
Triglycerides: 168 mg/dL — ABNORMAL HIGH (ref 0.0–149.0)
VLDL: 33.6 mg/dL (ref 0.0–40.0)

## 2012-02-14 LAB — HEPATIC FUNCTION PANEL
ALT: 15 U/L (ref 0–35)
Albumin: 4.1 g/dL (ref 3.5–5.2)
Total Bilirubin: 1 mg/dL (ref 0.3–1.2)
Total Protein: 7.1 g/dL (ref 6.0–8.3)

## 2012-02-14 LAB — BASIC METABOLIC PANEL
BUN: 26 mg/dL — ABNORMAL HIGH (ref 6–23)
CO2: 27 mEq/L (ref 19–32)
Chloride: 105 mEq/L (ref 96–112)
Creatinine, Ser: 1.1 mg/dL (ref 0.4–1.2)
Glucose, Bld: 111 mg/dL — ABNORMAL HIGH (ref 70–99)

## 2012-02-14 NOTE — Assessment & Plan Note (Signed)
The patient has a history of high blood pressure.  He has not been having any headaches or dizzy spells.  No exertional chest pain.  No symptoms of CHF.  No edema.

## 2012-02-14 NOTE — Assessment & Plan Note (Signed)
The patient is not having any side effects from the Crestor.  Her arthralgias appeared to be musculoskeletal related to osteoarthritis.  She is on low-dose Crestor and we are checking labs today

## 2012-02-14 NOTE — Patient Instructions (Signed)
Will obtain labs today and call you with the results  Your physician recommends that you continue on your current medications as directed. Please refer to the Current Medication list given to you today.  Your physician wants you to follow-up in: 6 month You will receive a reminder letter in the mail two months in advance. If you don't receive a letter, please call our office to schedule the follow-up appointment.  

## 2012-02-14 NOTE — Progress Notes (Signed)
Quick Note:  Please report to patient. The recent labs are stable. Continue same medication and careful diet.Cholesterol better. BS still slightly high. ______

## 2012-02-14 NOTE — Progress Notes (Signed)
Katherene Ponto Date of Birth:  December 10, 1932 [facility] [address] [place], [place]  [address] [phone]  Fax   [phone]  HPI: This pleasant 78 year old woman is seen for a six-month followup office visit.  She has a history of essential hypertension and hypercholesterolemia.  She also has a past history of breast cancer.  Since last visit she has been doing well with no new cardiac complaints.  She has not been getting much regular exercise.  Her weight is unchanged.  Been having some mild diffuse myalgias and arthralgias but not severe.  She is due for colonoscopy in August by Dr. Bosie Clos.  She is having some occasional dysphagia and she is also going to request an upper endoscopy.  Current Outpatient Prescriptions  Medication Sig Dispense Refill  . ACTONEL 35 MG tablet TAKE AS DIRECTED  3 tablet  3  . aspirin 81 MG tablet Take 160 mg by mouth daily.      . CRESTOR 5 MG tablet TAKE 1 TABLET MONDAY, WEDNESDAY, AND FRIDAY  39 tablet  3  . metoprolol succinate (TOPROL-XL) 50 MG 24 hr tablet TAKE ONE-HALF (1/2) TABLET DAILY  45 tablet  3  . MICARDIS HCT 80-12.5 MG per tablet TAKE 1 TABLET DAILY  90 tablet  0  . omeprazole (PRILOSEC) 20 MG capsule Take 1 capsule (20 mg total) by mouth 2 (two) times daily.  180 capsule  3    Allergies  Allergen Reactions  . Morphine And Related     vomiting    Patient Active Problem List  Diagnoses  . Pure hypercholesterolemia  . Benign hypertensive heart disease without heart failure  . Breast cancer    History  Smoking status  . Never Smoker   Smokeless tobacco  . Never Used    History  Alcohol Use No    No family history on file.  Review of Systems: The patient denies any heat or cold intolerance.  No weight gain or weight loss.  The patient denies headaches or blurry vision.  There is no cough or sputum production.  The patient denies dizziness.  There is no hematuria or hematochezia.  The patient denies  any muscle aches or arthritis.  The patient denies any rash.  The patient denies frequent falling or instability.  There is no history of depression or anxiety.  All other systems were reviewed and are negative.   Physical Exam: Filed Vitals:   02/14/12 0836  BP: 122/62  Pulse: 70  Resp: 17   the general appearance reveals a well-developed well-nourished woman in no distress.The head and neck exam reveals pupils equal and reactive.  Extraocular movements are full.  There is no scleral icterus.  The mouth and pharynx are normal.  The neck is supple.  The carotids reveal no bruits.  The jugular venous pressure is normal.  The  thyroid is not enlarged.  There is no lymphadenopathy.  The chest is clear to percussion and auscultation.  There are no rales or rhonchi.  Expansion of the chest is symmetrical.  The precordium is quiet.  The first heart sound is normal.  The second heart sound is physiologically split.  There is no murmur gallop rub or click.  There is no abnormal lift or heave.  The abdomen is soft and nontender.  The bowel sounds are normal.  The liver and spleen are not enlarged.  There are no abdominal masses.  There are no abdominal bruits.  Extremities reveal good pedal  pulses.  There is no phlebitis or edema.  There is no cyanosis or clubbing.  Strength is normal and symmetrical in all extremities.  There is no lateralizing weakness.  There are no sensory deficits.  The skin is warm and dry.  There is no rash.      Assessment / Plan: Patient is to continue same medication.  She will work harder on exercise and weight loss.  Blood work today pending.  Recheck in 6 months for followup office visit EKG and fasting lab work.

## 2012-02-15 ENCOUNTER — Telehealth: Payer: Self-pay | Admitting: Cardiology

## 2012-02-15 NOTE — Telephone Encounter (Signed)
Fu call Pt was calling you back about lab results

## 2012-02-15 NOTE — Telephone Encounter (Signed)
Message copied by Burnell Blanks on Fri Feb 15, 2012  4:20 PM ------      Message from: Cassell Clement      Created: Thu Feb 14, 2012  8:36 PM       Please report to patient.  The recent labs are stable. Continue same medication and careful diet.Cholesterol better. BS still slightly high.

## 2012-02-15 NOTE — Telephone Encounter (Signed)
Advised of labs 

## 2012-03-07 DIAGNOSIS — Z853 Personal history of malignant neoplasm of breast: Secondary | ICD-10-CM | POA: Diagnosis not present

## 2012-03-18 ENCOUNTER — Other Ambulatory Visit (HOSPITAL_BASED_OUTPATIENT_CLINIC_OR_DEPARTMENT_OTHER): Payer: Medicare Other | Admitting: Lab

## 2012-03-18 DIAGNOSIS — C50919 Malignant neoplasm of unspecified site of unspecified female breast: Secondary | ICD-10-CM

## 2012-03-18 LAB — CBC WITH DIFFERENTIAL/PLATELET
Basophils Absolute: 0 10*3/uL (ref 0.0–0.1)
Eosinophils Absolute: 0.1 10*3/uL (ref 0.0–0.5)
HGB: 11.4 g/dL — ABNORMAL LOW (ref 11.6–15.9)
LYMPH%: 31.8 % (ref 14.0–49.7)
MCV: 95.6 fL (ref 79.5–101.0)
MONO%: 9.9 % (ref 0.0–14.0)
NEUT#: 2.9 10*3/uL (ref 1.5–6.5)
Platelets: 157 10*3/uL (ref 145–400)
RBC: 3.44 10*6/uL — ABNORMAL LOW (ref 3.70–5.45)

## 2012-03-18 LAB — COMPREHENSIVE METABOLIC PANEL
CO2: 30 mEq/L (ref 19–32)
Creatinine, Ser: 1.23 mg/dL — ABNORMAL HIGH (ref 0.50–1.10)
Glucose, Bld: 97 mg/dL (ref 70–99)
Total Bilirubin: 0.7 mg/dL (ref 0.3–1.2)

## 2012-03-25 ENCOUNTER — Telehealth: Payer: Self-pay | Admitting: Oncology

## 2012-03-25 ENCOUNTER — Ambulatory Visit (HOSPITAL_BASED_OUTPATIENT_CLINIC_OR_DEPARTMENT_OTHER): Payer: Medicare Other | Admitting: Oncology

## 2012-03-25 DIAGNOSIS — C50419 Malignant neoplasm of upper-outer quadrant of unspecified female breast: Secondary | ICD-10-CM | POA: Diagnosis not present

## 2012-03-25 DIAGNOSIS — C50919 Malignant neoplasm of unspecified site of unspecified female breast: Secondary | ICD-10-CM

## 2012-03-25 DIAGNOSIS — Z170 Estrogen receptor positive status [ER+]: Secondary | ICD-10-CM

## 2012-03-25 NOTE — Progress Notes (Signed)
ID: Tammy Oneill   DOB: 03-18-1933  MR#: [id number]  WUJ#:[id number]  HISTORY OF PRESENT ILLNESS:  She was feeling fine, and had no particular symptoms when she had routine mammographic screening at [facility] March 02, 2010.  Her prior mammogram had been in 2008.  This time Dr. Yolanda Bonine noted an irregular area of focal asymmetry in the upper outer quadrant of the right breast, and the patient was brought back for a diagnostic mammography and right breast ultrasonography March 13, 2010.  This confirmed an irregular ill defined, mixed density/hyperdense mass in the right breast with some spiculation and distortion, which by ultrasound measured 1.2 cm.  There was also a smaller, though otherwise similar lesion more posteriorly, and that one measured 8 mm by ultrasound.  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  Both were estrogen and progesterone receptor positive and HER2-, and both had a low proliferation fraction, although morphologically there was some difference between the masses, so that at the Multidisciplinary Breast Cancer Conference this morning, it was felt most likely this were two concurrent primaries.  With this information the patient was referred to Dr. Dwain Sarna, and bilateral breast MRIs were obtained January 7th.  This showed the larger mass to be 1.4 cm, the smaller one to measure 1.1 cm, but there was no other finding of significance in either breast, and there was no evidence of adenopathy.   Accordingly, the patient proceeded to definitive lumpectomies, which were performed August 17.  The final pathology from this procedure ([id number]) showed two invasive ductal carcinomas, the larger measuring 1.4 cm, the smaller 7 mm, again both ER/PR+, and HER2-.  Both were grade 1.  Margins were close, although negative, and there was no evidence of lymphovascular invasion.  Zero of two sentinel  lymph nodes were involved.  The patient is staged at T1c N0, or stage I.  Her subsequent history is as detailed below.  INTERVAL HISTORY: Tammy Oneill returns for routine followup of her breast cancer. She is doing "fine" in the interval history is generally unremarkable. She doesn't exercise formally but does a lot of gardening work and occasionally takes to walk with a friend.  REVIEW OF SYSTEMS: She has muscle cramps especially at night of. These are not more frequent or intense than prior. Sometimes her feet feel cold. She has chronic low back pain which she describes as mild. Otherwise a detailed review of systems is entirely negative  PAST MEDICAL HISTORY: Past Medical History  Diagnosis Date  . Hypertension   . Hyperlipidemia   The past medical history is significant for osteopenia, history of cervical cancer status post simple hysterectomy without salpingo oophorectomy at the age of 69, history of osteoarthritis especially involving the right shoulder where she says she has a fairly chronic bursitis, hypercholesterolemia, history of frequent urinary tract infections, history of appendectomy, history of cataracts, not yet surgically removed (she tells me Dr. Dione Booze is planning to do this in January), and history of "an enlarged heart" although this was not seen in the most recent chest x-ray.    PAST SURGICAL HISTORY: No past surgical history on file.  FAMILY HISTORY The patient's mother died with bladder cancer at the age of 58.  The patient's father died with lung cancer at the age of 51.  She had one brother who died in an automobile accident. One sister is "okay".  There is no breast or ovarian cancer in  the family except for the patient's older daughter, Lavenia Atlas, who lives in [place], and was diagnosed with breast cancer at the age of 85. She was treated at [facility].   GYNECOLOGIC HISTORY: GX P5.  First pregnancy to term at age 74.   Hysterectomy at age 12.  She took Premarin for many years, stopping about ten years ago.   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  She has been divorced twice, lives home alone, has no pets.  Son, Dorene Sorrow, works in a Radiation protection practitioner company here in [place], as does son, Aileen Pilot..  Daughter, Lavenia Atlas in [place] had breast cancer. Daughter,Jamie Aline August is also my patient and works at [employer] here in [place]. Daughter, Angelica Pou, works in Consulting civil engineer in [place], [place].  The patient has nine grandchildren and three great-grandchildren.  She attends [employer].     ADVANCED DIRECTIVES:  HEALTH MAINTENANCE: History  Substance Use Topics  . Smoking status: Never Smoker   . Smokeless tobacco: Never Used  . Alcohol Use: No     Colonoscopy:  PAP:  Bone density:  Lipid panel:  Allergies  Allergen Reactions  . Morphine And Related     vomiting    Current Outpatient Prescriptions  Medication Sig Dispense Refill  . ACTONEL 35 MG tablet TAKE AS DIRECTED  3 tablet  3  . aspirin 81 MG tablet Take 160 mg by mouth daily.      . CRESTOR 5 MG tablet TAKE 1 TABLET MONDAY, WEDNESDAY, AND FRIDAY  39 tablet  3  . metoprolol succinate (TOPROL-XL) 50 MG 24 hr tablet TAKE ONE-HALF (1/2) TABLET DAILY  45 tablet  3  . MICARDIS HCT 80-12.5 MG per tablet TAKE 1 TABLET DAILY  90 tablet  0    OBJECTIVE: Elderly white woman in no acute distress There were no vitals filed for this visit.   There is no height or weight on file to calculate BMI.    ECOG FS: 0  Sclerae unicteric Oropharynx clear No cervical or supraclavicular adenopathy Lungs no rales or rhonchi Heart regular rate and rhythm Abd benign MSK no focal spinal tenderness, no peripheral edema Neuro: nonfocal Breasts: The right breast is status post lumpectomy and radiation. There is no evidence of local recurrence. The left breast is  unremarkable.  LAB RESULTS: Lab Results  Component Value Date   WBC 5.2 03/18/2012   NEUTROABS 2.9 03/18/2012   HGB 11.4* 03/18/2012   HCT 32.9* 03/18/2012   MCV 95.6 03/18/2012   PLT 157 03/18/2012      Chemistry      Component Value Date/Time   NA 137 03/18/2012 1433   K 4.5 03/18/2012 1433   CL 101 03/18/2012 1433   CO2 30 03/18/2012 1433   BUN 17 03/18/2012 1433   CREATININE 1.23* 03/18/2012 1433      Component Value Date/Time   CALCIUM 9.7 03/18/2012 1433   ALKPHOS 68 03/18/2012 1433   AST 14 03/18/2012 1433   ALT 10 03/18/2012 1433   BILITOT 0.7 03/18/2012 1433       Lab Results  Component Value Date   LABCA2 21 03/18/2012    No components found with this basename: [id number]    No results found for this basename: INR:1;PROTIME:1 in the last 168 hours  Urinalysis No results found for this basename: colorurine,  appearanceur,  labspec,  phurine,  glucoseu,  hgbur,  bilirubinur,  ketonesur,  proteinur,  urobilinogen,  nitrite,  leukocytesur    STUDIES: Mammography 03/07/2012 was unremarkable  ASSESSMENT: 78 y.o. BRCA negative [place] woman  (1) status post double right-sided lumpectomies August 2011 for a T1c and T1b N0, stage IA invasive ductal carcinomas, both strongly estrogen and progesterone receptor positive, HER2/neu negative, with low proliferation fractions.   (2) completed radiation therapy October 2011  (3) refused adjuvant antiestrogens.    PLAN: She'll is doing very well, with no evidence of disease recurrence. I am going to see her on a yearly basis until she completes her 5 years of followup. She knows to call for any problems that may develop before the next visit.   MAGRINAT,GUSTAV C    03/26/2012

## 2012-03-25 NOTE — Telephone Encounter (Signed)
gve the pt her July 2014 appt calendar °

## 2012-05-05 DIAGNOSIS — Z09 Encounter for follow-up examination after completed treatment for conditions other than malignant neoplasm: Secondary | ICD-10-CM | POA: Diagnosis not present

## 2012-05-05 DIAGNOSIS — Z86010 Personal history of colonic polyps: Secondary | ICD-10-CM | POA: Diagnosis not present

## 2012-05-05 DIAGNOSIS — K648 Other hemorrhoids: Secondary | ICD-10-CM | POA: Diagnosis not present

## 2012-05-05 DIAGNOSIS — K5730 Diverticulosis of large intestine without perforation or abscess without bleeding: Secondary | ICD-10-CM | POA: Diagnosis not present

## 2012-05-12 ENCOUNTER — Other Ambulatory Visit: Payer: Self-pay | Admitting: Cardiology

## 2012-05-12 MED ORDER — MICARDIS HCT 80-12.5 MG PO TABS: 1.0000 | ORAL_TABLET | Freq: Every day | ORAL | Status: DC

## 2012-06-04 ENCOUNTER — Other Ambulatory Visit: Payer: Self-pay | Admitting: *Deleted

## 2012-06-04 DIAGNOSIS — E785 Hyperlipidemia, unspecified: Secondary | ICD-10-CM

## 2012-06-04 MED ORDER — ROSUVASTATIN CALCIUM 5 MG PO TABS: ORAL_TABLET | ORAL | Status: DC

## 2012-06-04 NOTE — Telephone Encounter (Signed)
Refilled crestor 

## 2012-06-09 ENCOUNTER — Telehealth: Payer: Self-pay | Admitting: Cardiology

## 2012-06-09 NOTE — Telephone Encounter (Signed)
New Problem:    Patient called in needing a refill of her rosuvastatin (CRESTOR) 5 MG tablet.

## 2012-06-09 NOTE — Telephone Encounter (Signed)
thias refill was done.

## 2012-07-11 DIAGNOSIS — H04129 Dry eye syndrome of unspecified lacrimal gland: Secondary | ICD-10-CM | POA: Diagnosis not present

## 2012-07-11 DIAGNOSIS — Z961 Presence of intraocular lens: Secondary | ICD-10-CM | POA: Diagnosis not present

## 2012-10-10 ENCOUNTER — Other Ambulatory Visit: Payer: Self-pay

## 2012-10-10 MED ORDER — METOPROLOL SUCCINATE ER 50 MG PO TB24: 50.0000 mg | ORAL_TABLET | Freq: Every day | ORAL | Status: DC

## 2012-10-10 MED ORDER — RISEDRONATE SODIUM 35 MG PO TABS: ORAL_TABLET | ORAL | Status: DC

## 2012-10-22 ENCOUNTER — Telehealth: Payer: Self-pay | Admitting: Cardiology

## 2012-10-22 NOTE — Telephone Encounter (Signed)
New Problem:    Called in needing clarification on the instructions for the patient's risedronate (ACTONEL) 35 MG tablet and metoprolol succinate (TOPROL-XL) 50 MG 24 hr tablet.  Please call back. Ref#- [id number]

## 2012-10-23 ENCOUNTER — Telehealth: Payer: Self-pay | Admitting: Cardiology

## 2012-10-23 NOTE — Telephone Encounter (Signed)
rx for metoprolol was called in 45 tabs for 45 days , needs it resent for 90 days

## 2012-10-24 MED ORDER — METOPROLOL SUCCINATE ER 50 MG PO TB24: ORAL_TABLET | ORAL | Status: DC

## 2012-10-24 MED ORDER — RISEDRONATE SODIUM 35 MG PO TABS: ORAL_TABLET | ORAL | Status: DC

## 2012-10-24 NOTE — Telephone Encounter (Signed)
Discussed with patient

## 2012-10-24 NOTE — Telephone Encounter (Signed)
Refilled as requested   Called [facility] with 2 week supple of Toprol until she gets mail order

## 2012-10-24 NOTE — Telephone Encounter (Signed)
Regis Bill 10/24/2012 6:11 PM Signed  Refilled as requested  Called [facility] with 2 week supple of Toprol until she gets mail order

## 2012-11-03 ENCOUNTER — Other Ambulatory Visit: Payer: Self-pay

## 2012-11-03 MED ORDER — METOPROLOL SUCCINATE ER 50 MG PO TB24: ORAL_TABLET | ORAL | Status: DC

## 2013-01-01 DIAGNOSIS — E782 Mixed hyperlipidemia: Secondary | ICD-10-CM | POA: Diagnosis not present

## 2013-01-01 DIAGNOSIS — Z79899 Other long term (current) drug therapy: Secondary | ICD-10-CM | POA: Diagnosis not present

## 2013-01-01 DIAGNOSIS — M810 Age-related osteoporosis without current pathological fracture: Secondary | ICD-10-CM | POA: Diagnosis not present

## 2013-01-01 DIAGNOSIS — Z0000 Encounter for general adult medical examination without abnormal findings: Secondary | ICD-10-CM | POA: Diagnosis not present

## 2013-01-01 DIAGNOSIS — Z1331 Encounter for screening for depression: Secondary | ICD-10-CM | POA: Diagnosis not present

## 2013-01-01 DIAGNOSIS — I10 Essential (primary) hypertension: Secondary | ICD-10-CM | POA: Diagnosis not present

## 2013-01-08 DIAGNOSIS — E782 Mixed hyperlipidemia: Secondary | ICD-10-CM | POA: Diagnosis not present

## 2013-01-08 DIAGNOSIS — I10 Essential (primary) hypertension: Secondary | ICD-10-CM | POA: Diagnosis not present

## 2013-01-08 DIAGNOSIS — H908 Mixed conductive and sensorineural hearing loss, unspecified: Secondary | ICD-10-CM | POA: Diagnosis not present

## 2013-01-08 DIAGNOSIS — M810 Age-related osteoporosis without current pathological fracture: Secondary | ICD-10-CM | POA: Diagnosis not present

## 2013-02-12 DIAGNOSIS — E2839 Other primary ovarian failure: Secondary | ICD-10-CM | POA: Diagnosis not present

## 2013-02-20 ENCOUNTER — Other Ambulatory Visit: Payer: Self-pay | Admitting: *Deleted

## 2013-02-20 MED ORDER — MICARDIS HCT 80-12.5 MG PO TABS: 1.0000 | ORAL_TABLET | Freq: Every day | ORAL | Status: DC

## 2013-02-20 NOTE — Telephone Encounter (Signed)
Fax Received. Refill Completed. Tammy Oneill (R.M.A)   

## 2013-03-19 ENCOUNTER — Other Ambulatory Visit: Payer: Self-pay | Admitting: *Deleted

## 2013-03-23 ENCOUNTER — Other Ambulatory Visit: Payer: Medicare Other | Admitting: Lab

## 2013-03-30 ENCOUNTER — Ambulatory Visit (HOSPITAL_BASED_OUTPATIENT_CLINIC_OR_DEPARTMENT_OTHER): Payer: Medicare Other | Admitting: Oncology

## 2013-03-30 ENCOUNTER — Telehealth: Payer: Self-pay | Admitting: *Deleted

## 2013-03-30 VITALS — BP 125/71 | HR 59 | Temp 97.8°F | Resp 20 | Ht 62.0 in | Wt 146.1 lb

## 2013-03-30 DIAGNOSIS — C50919 Malignant neoplasm of unspecified site of unspecified female breast: Secondary | ICD-10-CM

## 2013-03-30 NOTE — Telephone Encounter (Signed)
appts made and printed. i gv appt d/t for [facility] 04/06/13 @ 10:45am. Pt is aware...td

## 2013-03-30 NOTE — Progress Notes (Signed)
ID: Katherene Ponto   DOB: 05/27/1933  MR#: [id number]  NWG#:[id number]  HISTORY OF PRESENT ILLNESS:  She was feeling fine, and had no particular symptoms when she had routine mammographic screening at [facility] March 02, 2010.  Her prior mammogram had been in 2008.  This time Dr. Yolanda Bonine noted an irregular area of focal asymmetry in the upper outer quadrant of the right breast, and the patient was brought back for a diagnostic mammography and right breast ultrasonography March 13, 2010.  This confirmed an irregular ill defined, mixed density/hyperdense mass in the right breast with some spiculation and distortion, which by ultrasound measured 1.2 cm.  There was also a smaller, though otherwise similar lesion more posteriorly, and that one measured 8 mm by ultrasound.  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  Both were estrogen and progesterone receptor positive and HER2-, and both had a low proliferation fraction, although morphologically there was some difference between the masses, so that at the Multidisciplinary Breast Cancer Conference this morning, it was felt most likely this were two concurrent primaries.  With this information the patient was referred to Dr. Dwain Sarna, and bilateral breast MRIs were obtained January 7th.  This showed the larger mass to be 1.4 cm, the smaller one to measure 1.1 cm, but there was no other finding of significance in either breast, and there was no evidence of adenopathy.   Accordingly, the patient proceeded to definitive lumpectomies, which were performed August 17.  The final pathology from this procedure ([id number]) showed two invasive ductal carcinomas, the larger measuring 1.4 cm, the smaller 7 mm, again both ER/PR+, and HER2-.  Both were grade 1.  Margins were close, although negative, and there was no evidence of lymphovascular invasion.  Zero of two sentinel  lymph nodes were involved.  The patient is staged at T1c N0, or stage I.  Her subsequent history is as detailed below.  INTERVAL HISTORY: Genell returns for  followup of her breast cancer. The is will. She tells me she had cataract surgery and she can see better now.  REVIEW OF SYSTEMS: She is not exercising regularly. She does some yard work. There have been no unusual headaches, visual changes, cough, phlegm production, pleurisy, shortness of breath, or change in bowel or bladder habits. There has been no unusual pain, fever, rash, bleeding, or unexplained fatigue or weight loss. She does say she sometimes get a bit depressed a living by herself. Other times she feels fine. She wishes she "were more even". A detailed review of systems was unremarkable except as noted  PAST MEDICAL HISTORY: Past Medical History  Diagnosis Date  . Hypertension   . Hyperlipidemia   The past medical history is significant for osteopenia, history of cervical cancer status post simple hysterectomy without salpingo oophorectomy at the age of 39, history of osteoarthritis especially involving the right shoulder where she says she has a fairly chronic bursitis, hypercholesterolemia, history of frequent urinary tract infections, history of appendectomy, history of cataracts, not yet surgically removed (she tells me Dr. Dione Booze is planning to do this in January), and history of "an enlarged heart" although this was not seen in the most recent chest x-ray.    PAST SURGICAL HISTORY: No past surgical history on file.  FAMILY HISTORY The patient's mother died with bladder cancer at the age of 31.  The patient's father died with lung cancer at the age of  66.  She had one brother who died in an automobile accident. One sister is "okay".  There is no breast or ovarian cancer in the family except for the patient's older daughter, Lavenia Atlas, who lives in [place], and was diagnosed with breast cancer at the age of 41. She was  treated at [facility].   GYNECOLOGIC HISTORY: GX P5.  First pregnancy to term at age 26.  Hysterectomy at age 89.  She took Premarin for many years, stopping about ten years ago.   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  She has been divorced twice, lives home alone, has no pets.  Son, Dorene Sorrow, works in a Radiation protection practitioner company here in [place], as does son, Aileen Pilot..  Daughter, Lavenia Atlas in [place] had breast cancer. Daughter,Jamie Aline August is also my patient and works at [employer] here in [place]. Daughter, Angelica Pou, works in Consulting civil engineer in [place], [place].  The patient has nine grandchildren and three great-grandchildren.  She attends [employer].     ADVANCED DIRECTIVES:  HEALTH MAINTENANCE: History  Substance Use Topics  . Smoking status: Never Smoker   . Smokeless tobacco: Never Used  . Alcohol Use: No     Colonoscopy:  PAP:  Bone density:  Lipid panel:  Allergies  Allergen Reactions  . Morphine And Related     vomiting    Current Outpatient Prescriptions  Medication Sig Dispense Refill  . aspirin 81 MG tablet Take 160 mg by mouth daily.      . metoprolol succinate (TOPROL-XL) 50 MG 24 hr tablet Take 1/2 tablet daily  45 tablet  3  . MICARDIS HCT 80-12.5 MG per tablet Take 1 tablet by mouth daily.  90 tablet  0  . risedronate (ACTONEL) 35 MG tablet 1 tablet weekly  12 tablet  3  . rosuvastatin (CRESTOR) 5 MG tablet Taking one on Monday,wednesday and friday  39 tablet  3   No current facility-administered medications for this visit.    OBJECTIVE: Elderly white woman in no acute distress Filed Vitals:   03/30/13 1518  BP: 125/71  Pulse: 59  Temp: 97.8 F (36.6 C)  Resp: 20     Body mass index is 26.72 kg/(m^2).    ECOG FS: 0  Sclerae unicteric Oropharynx clear No cervical or supraclavicular adenopathy Lungs no rales or  rhonchi Heart regular rate and rhythm Abd benign MSK no focal spinal tenderness, no peripheral edema Neuro: nonfocal, well oriented, anxious affect Breasts: The right breast is status post lumpectomy and radiation. There is no evidence of local recurrence. The left breast is unremarkable.  LAB RESULTS: Lab Results  Component Value Date   WBC 5.2 03/18/2012   NEUTROABS 2.9 03/18/2012   HGB 11.4* 03/18/2012   HCT 32.9* 03/18/2012   MCV 95.6 03/18/2012   PLT 157 03/18/2012      Chemistry      Component Value Date/Time   NA 137 03/18/2012 1433   K 4.5 03/18/2012 1433   CL 101 03/18/2012 1433   CO2 30 03/18/2012 1433   BUN 17 03/18/2012 1433   CREATININE 1.23* 03/18/2012 1433      Component Value Date/Time   CALCIUM 9.7 03/18/2012 1433   ALKPHOS 68 03/18/2012 1433   AST 14 03/18/2012 1433   ALT 10 03/18/2012 1433   BILITOT 0.7 03/18/2012 1433  Lab Results  Component Value Date   LABCA2 21 03/18/2012    No components found with this basename: [id number]    No results found for this basename: INR,  in the last 168 hours  Urinalysis No results found for this basename: colorurine,  appearanceur,  labspec,  phurine,  glucoseu,  hgbur,  bilirubinur,  ketonesur,  proteinur,  urobilinogen,  nitrite,  leukocytesur    STUDIES: Mammography this year is overdue  ASSESSMENT: 79 y.o. BRCA negative [place] woman  (1) status post double right-sided lumpectomies August 2011 for a T1c and T1b N0, stage IA invasive ductal carcinomas, both strongly estrogen and progesterone receptor positive, HER2/neu negative, with low proliferation fractions.   (2) completed radiation therapy October 2011  (3) refused adjuvant antiestrogens.    PLAN: Neftali is doing well now just about 3 years out from her breast cancer surgery. I have set her up for mammography at [facility] in the next week or so and then a repeat visit here in a year after her next years mammogram. When we reach the 5 year followup which will be 2016, we  will release her to her primary care physician.  In the meantime I have strongly encouraged her to exercise more and I gave her a live strong pamphlet. She will let us know if she has any questions before the next visit here  MAGRINAT,GUSTAV C    03/30/2013

## 2013-04-06 DIAGNOSIS — Z853 Personal history of malignant neoplasm of breast: Secondary | ICD-10-CM | POA: Diagnosis not present

## 2013-04-10 ENCOUNTER — Encounter: Payer: Self-pay | Admitting: Cardiology

## 2013-04-10 ENCOUNTER — Ambulatory Visit (INDEPENDENT_AMBULATORY_CARE_PROVIDER_SITE_OTHER): Payer: Medicare Other | Admitting: Cardiology

## 2013-04-10 VITALS — BP 132/78 | HR 54 | Ht 62.0 in | Wt 145.4 lb

## 2013-04-10 DIAGNOSIS — I119 Hypertensive heart disease without heart failure: Secondary | ICD-10-CM

## 2013-04-10 DIAGNOSIS — E7800 Pure hypercholesterolemia, unspecified: Secondary | ICD-10-CM | POA: Diagnosis not present

## 2013-04-10 DIAGNOSIS — I498 Other specified cardiac arrhythmias: Secondary | ICD-10-CM

## 2013-04-10 DIAGNOSIS — R001 Bradycardia, unspecified: Secondary | ICD-10-CM | POA: Insufficient documentation

## 2013-04-10 MED ORDER — METOPROLOL SUCCINATE ER 25 MG PO TB24: ORAL_TABLET | ORAL | Status: DC

## 2013-04-10 NOTE — Progress Notes (Signed)
Tammy Oneill Date of Birth:  Dec 17, 1932 [facility] [address] [place], [place]  [address] [phone]  Fax   [phone]  HPI: This pleasant 79 year old woman is seen for a one-year followup office visit.  She has a past history of essential hypertension and hypercholesterolemia.  She also has a remote history of breast cancer.  He has a history of osteopenia.  Since last visit she has been doing well.  Current Outpatient Prescriptions  Medication Sig Dispense Refill  . aspirin 81 MG tablet Take 81 mg by mouth daily.       . metoprolol succinate (TOPROL-XL) 25 MG 24 hr tablet Take 1/2 tablet daily  45 tablet  3  . MICARDIS HCT 80-12.5 MG per tablet Take 1 tablet by mouth daily.  90 tablet  0  . risedronate (ACTONEL) 35 MG tablet 1 tablet weekly  12 tablet  3  . rosuvastatin (CRESTOR) 5 MG tablet Taking one on Monday,wednesday and friday  39 tablet  3   No current facility-administered medications for this visit.    Allergies  Allergen Reactions  . Morphine And Related     vomiting    Patient Active Problem List   Diagnosis Date Noted  . Sinus bradycardia 04/10/2013  . Pure hypercholesterolemia 06/21/2011  . Benign hypertensive heart disease without heart failure 06/21/2011  . Breast cancer 06/21/2011    History  Smoking status  . Never Smoker   Smokeless tobacco  . Never Used    History  Alcohol Use No    No family history on file.  Review of Systems: The patient denies any heat or cold intolerance.  No weight gain or weight loss.  The patient denies headaches or blurry vision.  There is no cough or sputum production.  The patient denies dizziness.  There is no hematuria or hematochezia.  The patient denies any muscle aches or arthritis.  The patient denies any rash.  The patient denies frequent falling or instability.  There is no history of depression or anxiety.  All other systems were reviewed and are negative.   Physical  Exam: Filed Vitals:   04/10/13 1341  BP: 132/78  Pulse: 54   the general appearance reveals a well-developed well-nourished woman in no distress.The head and neck exam reveals pupils equal and reactive.  Extraocular movements are full.  There is no scleral icterus.  The mouth and pharynx are normal.  The neck is supple.  The carotids reveal no bruits.  The jugular venous pressure is normal.  The  thyroid is not enlarged.  There is no lymphadenopathy.  The chest is clear to percussion and auscultation.  There are no rales or rhonchi.  Expansion of the chest is symmetrical.  The precordium is quiet.  The first heart sound is normal.  The second heart sound is physiologically split.  There is no murmur gallop rub or click.  There is no abnormal lift or heave.  The abdomen is soft and nontender.  The bowel sounds are normal.  The liver and spleen are not enlarged.  There are no abdominal masses.  There are no abdominal bruits.  Extremities reveal good pedal pulses.  There is no phlebitis or edema.  There is no cyanosis or clubbing.  Strength is normal and symmetrical in all extremities.  There is no lateralizing weakness.  There are no sensory deficits.  The skin is warm and dry.  There is no rash.  EKG shows sinus bradycardia with first  degree AV block and otherwise normal.    Assessment / Plan: Continue same medication except reduction in dose of Toprol to 12.5 mg daily.  Recheck in one year for office visit and EKG.  Her PCP is following her lipids

## 2013-04-10 NOTE — Patient Instructions (Signed)
Decrease your Toprol to 25 mg 1/2 tablet daily, rx sent to pharmacy  Your physician wants you to follow-up in: 1 year ov/ekg You will receive a reminder letter in the mail two months in advance. If you don't receive a letter, please call our office to schedule the follow-up appointment.

## 2013-04-10 NOTE — Assessment & Plan Note (Signed)
The patient has a history of hypercholesterolemia.  She's not having any side effects from the statin therapy.

## 2013-04-10 NOTE — Assessment & Plan Note (Signed)
Blood pressure is stable on current therapy.  EKG shows sinus bradycardia and the patient complains of lack of energy.  We will reduce her Toprol to just 12.5 mg daily and see if this helps give her more energy

## 2013-05-21 ENCOUNTER — Encounter: Payer: Self-pay | Admitting: Oncology

## 2013-05-27 DIAGNOSIS — C44519 Basal cell carcinoma of skin of other part of trunk: Secondary | ICD-10-CM | POA: Diagnosis not present

## 2013-05-27 DIAGNOSIS — D235 Other benign neoplasm of skin of trunk: Secondary | ICD-10-CM | POA: Diagnosis not present

## 2013-06-16 ENCOUNTER — Other Ambulatory Visit: Payer: Self-pay | Admitting: Cardiology

## 2013-06-26 DIAGNOSIS — Z85828 Personal history of other malignant neoplasm of skin: Secondary | ICD-10-CM | POA: Diagnosis not present

## 2013-08-28 ENCOUNTER — Other Ambulatory Visit: Payer: Self-pay | Admitting: Cardiology

## 2013-11-05 ENCOUNTER — Other Ambulatory Visit: Payer: Self-pay | Admitting: Cardiology

## 2014-01-21 DIAGNOSIS — I10 Essential (primary) hypertension: Secondary | ICD-10-CM | POA: Diagnosis not present

## 2014-01-21 DIAGNOSIS — E782 Mixed hyperlipidemia: Secondary | ICD-10-CM | POA: Diagnosis not present

## 2014-01-21 DIAGNOSIS — Z1331 Encounter for screening for depression: Secondary | ICD-10-CM | POA: Diagnosis not present

## 2014-01-21 DIAGNOSIS — M810 Age-related osteoporosis without current pathological fracture: Secondary | ICD-10-CM | POA: Diagnosis not present

## 2014-01-21 DIAGNOSIS — K2100 Gastro-esophageal reflux disease with esophagitis, without bleeding: Secondary | ICD-10-CM | POA: Diagnosis not present

## 2014-01-28 DIAGNOSIS — K2100 Gastro-esophageal reflux disease with esophagitis, without bleeding: Secondary | ICD-10-CM | POA: Diagnosis not present

## 2014-01-28 DIAGNOSIS — I10 Essential (primary) hypertension: Secondary | ICD-10-CM | POA: Diagnosis not present

## 2014-01-28 DIAGNOSIS — E782 Mixed hyperlipidemia: Secondary | ICD-10-CM | POA: Diagnosis not present

## 2014-01-28 DIAGNOSIS — M810 Age-related osteoporosis without current pathological fracture: Secondary | ICD-10-CM | POA: Diagnosis not present

## 2014-01-31 ENCOUNTER — Other Ambulatory Visit: Payer: Self-pay | Admitting: Cardiology

## 2014-02-20 ENCOUNTER — Other Ambulatory Visit: Payer: Self-pay | Admitting: Cardiology

## 2014-03-06 ENCOUNTER — Other Ambulatory Visit: Payer: Self-pay | Admitting: Cardiology

## 2014-03-12 ENCOUNTER — Other Ambulatory Visit: Payer: Self-pay

## 2014-03-12 MED ORDER — ROSUVASTATIN CALCIUM 5 MG PO TABS: ORAL_TABLET | ORAL | Status: DC

## 2014-04-06 ENCOUNTER — Other Ambulatory Visit: Payer: Self-pay | Admitting: *Deleted

## 2014-04-06 MED ORDER — METOPROLOL SUCCINATE ER 25 MG PO TB24: ORAL_TABLET | ORAL | Status: AC

## 2014-04-08 ENCOUNTER — Other Ambulatory Visit: Payer: Self-pay | Admitting: Cardiology

## 2014-04-23 ENCOUNTER — Ambulatory Visit (INDEPENDENT_AMBULATORY_CARE_PROVIDER_SITE_OTHER): Payer: Medicare Other | Admitting: Cardiology

## 2014-04-23 ENCOUNTER — Encounter: Payer: Self-pay | Admitting: Cardiology

## 2014-04-23 VITALS — BP 120/62 | HR 57 | Ht 62.0 in | Wt 141.8 lb

## 2014-04-23 DIAGNOSIS — R001 Bradycardia, unspecified: Secondary | ICD-10-CM

## 2014-04-23 DIAGNOSIS — C50911 Malignant neoplasm of unspecified site of right female breast: Secondary | ICD-10-CM

## 2014-04-23 DIAGNOSIS — I119 Hypertensive heart disease without heart failure: Secondary | ICD-10-CM | POA: Diagnosis not present

## 2014-04-23 DIAGNOSIS — E7800 Pure hypercholesterolemia, unspecified: Secondary | ICD-10-CM

## 2014-04-23 DIAGNOSIS — C50919 Malignant neoplasm of unspecified site of unspecified female breast: Secondary | ICD-10-CM | POA: Diagnosis not present

## 2014-04-23 DIAGNOSIS — I498 Other specified cardiac arrhythmias: Secondary | ICD-10-CM | POA: Diagnosis not present

## 2014-04-23 NOTE — Assessment & Plan Note (Signed)
A. she has not been having any chest discomfort or shortness of breath.  No tachycardia.  No palpitations dizziness or syncope.

## 2014-04-23 NOTE — Assessment & Plan Note (Signed)
He has a history of hypercholesterolemia and is on Crestor.  Her lipids are followed by her PCP.

## 2014-04-23 NOTE — Progress Notes (Signed)
Su Monks Date of Birth:  July 04, 1933 [place],   [address] [phone]  Fax   [phone]  HPI: This 80 year old woman is seen for a one-year followup office visit.  She has a past history of essential hypertension and hypercholesterolemia.  She has a past history of breast cancer in 2011 treated with 20 rounds of radiation therapy.  She has a history of osteopenia and has bone density studies at her PCP office.  Since last visit she has been feeling well.  Current Outpatient Prescriptions  Medication Sig Dispense Refill  . ACTONEL 35 MG tablet TAKE 1 TABLET BY MOUTH WEEKLY  12 tablet  1  . aspirin 81 MG tablet Take 81 mg by mouth 2 (two) times daily.       . metoprolol succinate (TOPROL-XL) 25 MG 24 hr tablet Take 1/2 tablet daily  45 tablet  3  . rosuvastatin (CRESTOR) 5 MG tablet TAKE 1 TABLET ON MONDAY, WEDNESDAY, AND FRIDAY  104 tablet  0  . telmisartan-hydrochlorothiazide (MICARDIS HCT) 80-12.5 MG per tablet TAKE 1 TABLET BY MOUTH EVERY DAY  90 tablet  0   No current facility-administered medications for this visit.    Allergies  Allergen Reactions  . Morphine And Related     vomiting    Patient Active Problem List   Diagnosis Date Noted  . Sinus bradycardia 04/10/2013  . Pure hypercholesterolemia 06/21/2011  . Benign hypertensive heart disease without heart failure 06/21/2011  . Breast cancer 06/21/2011    History  Smoking status  . Never Smoker   Smokeless tobacco  . Never Used    History  Alcohol Use No    No family history on file.  Review of Systems: The patient denies any heat or cold intolerance.  No weight gain or weight loss.  The patient denies headaches or blurry vision.  There is no cough or sputum production.  The patient denies dizziness.  There is no hematuria or hematochezia.  The patient denies any muscle aches or arthritis.  The patient denies any rash.  The patient denies frequent  falling or instability.  There is no history of depression or anxiety.  All other systems were reviewed and are negative.   Physical Exam: Filed Vitals:   04/23/14 1554  BP: 120/62  Pulse: 57   the general appearance reveals a well-developed well-nourished elderly woman in no distress.The head and neck exam reveals pupils equal and reactive.  Extraocular movements are full.  There is no scleral icterus.  The mouth and pharynx are normal.  The neck is supple.  The carotids reveal no bruits.  The jugular venous pressure is normal.  The  thyroid is not enlarged.  There is no lymphadenopathy.  The chest is clear to percussion and auscultation.  There are no rales or rhonchi.  Expansion of the chest is symmetrical.  The precordium is quiet.  The first heart sound is normal.  The second heart sound is physiologically split.  There is no murmur gallop rub or click.  There is no abnormal lift or heave.  The abdomen is soft and nontender.  The bowel sounds are normal.  The liver and spleen are not enlarged.  There are no abdominal masses.  There are no abdominal bruits.  Extremities reveal good pedal pulses.  There is no phlebitis or edema.  There is no cyanosis or clubbing.  Strength is normal and symmetrical in all extremities.  There  is no lateralizing weakness.  There are no sensory deficits.  The skin is warm and dry.  There is no rash.  EKG shows bradycardia with first degree AV block, otherwise normal EKG    Assessment / Plan: 1. essential hypertension 2. Hypercholesterolemia 3. past history of breast cancer right breast 2011  Disposition continue same medication.  Recheck in one year for followup office visit.  She will monitor her blood pressure at home with a home blood pressure device.  She will try reducing her ARB/HCTZ to just half a tablet daily to save on cost as long as her home blood pressure remains stable.

## 2014-04-23 NOTE — Assessment & Plan Note (Signed)
Followed by Dr. Jana Hakim.  No evidence of recurrent

## 2014-04-23 NOTE — Patient Instructions (Signed)
Your physician recommends that you continue on your current medications as directed. Please refer to the Current Medication list given to you today.  Your physician wants you to follow-up in: 1 year ov/ekg You will receive a reminder letter in the mail two months in advance. If you don't receive a letter, please call our office to schedule the follow-up appointment.  

## 2014-04-29 ENCOUNTER — Other Ambulatory Visit: Payer: Self-pay | Admitting: *Deleted

## 2014-04-29 DIAGNOSIS — E7800 Pure hypercholesterolemia, unspecified: Secondary | ICD-10-CM

## 2014-04-29 DIAGNOSIS — C50919 Malignant neoplasm of unspecified site of unspecified female breast: Secondary | ICD-10-CM

## 2014-04-30 ENCOUNTER — Other Ambulatory Visit (HOSPITAL_BASED_OUTPATIENT_CLINIC_OR_DEPARTMENT_OTHER): Payer: Medicare Other

## 2014-04-30 DIAGNOSIS — C50919 Malignant neoplasm of unspecified site of unspecified female breast: Secondary | ICD-10-CM

## 2014-04-30 DIAGNOSIS — Z853 Personal history of malignant neoplasm of breast: Secondary | ICD-10-CM | POA: Diagnosis not present

## 2014-04-30 DIAGNOSIS — E7800 Pure hypercholesterolemia, unspecified: Secondary | ICD-10-CM

## 2014-04-30 LAB — CBC WITH DIFFERENTIAL/PLATELET
BASO%: 0.7 % (ref 0.0–2.0)
BASOS ABS: 0 10*3/uL (ref 0.0–0.1)
EOS%: 2.7 % (ref 0.0–7.0)
Eosinophils Absolute: 0.1 10*3/uL (ref 0.0–0.5)
HCT: 34.6 % — ABNORMAL LOW (ref 34.8–46.6)
HEMOGLOBIN: 11.8 g/dL (ref 11.6–15.9)
LYMPH#: 1.3 10*3/uL (ref 0.9–3.3)
LYMPH%: 33.1 % (ref 14.0–49.7)
MCH: 32.7 pg (ref 25.1–34.0)
MCHC: 34.2 g/dL (ref 31.5–36.0)
MCV: 95.8 fL (ref 79.5–101.0)
MONO#: 0.4 10*3/uL (ref 0.1–0.9)
MONO%: 10.6 % (ref 0.0–14.0)
NEUT#: 2.1 10*3/uL (ref 1.5–6.5)
NEUT%: 52.9 % (ref 38.4–76.8)
Platelets: 169 10*3/uL (ref 145–400)
RBC: 3.61 10*6/uL — ABNORMAL LOW (ref 3.70–5.45)
RDW: 12.2 % (ref 11.2–14.5)
WBC: 4 10*3/uL (ref 3.9–10.3)

## 2014-04-30 LAB — COMPREHENSIVE METABOLIC PANEL (CC13)
ALBUMIN: 3.6 g/dL (ref 3.5–5.0)
ALK PHOS: 73 U/L (ref 40–150)
ALT: 12 U/L (ref 0–55)
AST: 19 U/L (ref 5–34)
Anion Gap: 8 mEq/L (ref 3–11)
BUN: 11.6 mg/dL (ref 7.0–26.0)
CALCIUM: 9.3 mg/dL (ref 8.4–10.4)
CHLORIDE: 106 meq/L (ref 98–109)
CO2: 26 mEq/L (ref 22–29)
Creatinine: 1 mg/dL (ref 0.6–1.1)
Glucose: 108 mg/dl (ref 70–140)
POTASSIUM: 4.2 meq/L (ref 3.5–5.1)
Sodium: 140 mEq/L (ref 136–145)
Total Bilirubin: 0.78 mg/dL (ref 0.20–1.20)
Total Protein: 6.9 g/dL (ref 6.4–8.3)

## 2014-05-06 ENCOUNTER — Other Ambulatory Visit: Payer: Self-pay | Admitting: Cardiology

## 2014-05-07 ENCOUNTER — Encounter: Payer: Self-pay | Admitting: Nurse Practitioner

## 2014-05-07 ENCOUNTER — Ambulatory Visit (HOSPITAL_BASED_OUTPATIENT_CLINIC_OR_DEPARTMENT_OTHER): Payer: Medicare Other | Admitting: Nurse Practitioner

## 2014-05-07 VITALS — BP 130/51 | HR 62 | Temp 98.1°F | Resp 20 | Ht 62.0 in | Wt 141.3 lb

## 2014-05-07 DIAGNOSIS — Z853 Personal history of malignant neoplasm of breast: Secondary | ICD-10-CM

## 2014-05-07 DIAGNOSIS — C50911 Malignant neoplasm of unspecified site of right female breast: Secondary | ICD-10-CM

## 2014-05-07 NOTE — Progress Notes (Signed)
ID: Su Monks   DOB: 1932-10-16  MR#: [id number]  SPQ#:[id number]  CHIEF COMPLAINT: right breast cancer CURRENT TREATMENT: observation  BREAST CANCER HISTORY:  She was feeling fine, and had no particular symptoms when she had routine mammographic screening at [facility] March 02, 2010.  Her prior mammogram had been in 2008.  This time Dr. Isaiah Blakes noted an irregular area of focal asymmetry in the upper outer quadrant of the right breast, and the patient was brought back for a diagnostic mammography and right breast ultrasonography March 13, 2010.  This confirmed an irregular ill defined, mixed density/hyperdense mass in the right breast with some spiculation and distortion, which by ultrasound measured 1.2 cm.  There was also a smaller, though otherwise similar lesion more posteriorly, and that one measured 8 mm by ultrasound.  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  Both were estrogen and progesterone receptor positive and HER2-, and both had a low proliferation fraction, although morphologically there was some difference between the masses, so that at the Multidisciplinary Breast Cancer Conference this morning, it was felt most likely this were two concurrent primaries.  With this information the patient was referred to Dr. Donne Hazel, and bilateral breast MRIs were obtained January 7th.  This showed the larger mass to be 1.4 cm, the smaller one to measure 1.1 cm, but there was no other finding of significance in either breast, and there was no evidence of adenopathy.   Accordingly, the patient proceeded to definitive lumpectomies, which were performed August 17.  The final pathology from this procedure ([id number]) showed two invasive ductal carcinomas, the larger measuring 1.4 cm, the smaller 7 mm, again both ER/PR+, and HER2-.  Both were grade 1.  Margins were close, although negative, and there was  no evidence of lymphovascular invasion.  Zero of two sentinel lymph nodes were involved.  The patient is staged at T1c N0, or stage I.  Her subsequent history is as detailed below.  INTERVAL HISTORY: Jene returns for follow up of her breast cancer. She refused anti-estrogen therapy in 2011 and has only been followed up with observation. 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   REVIEW OF SYSTEMS: Danita does not exercise regularly but does her own yard work with a Chiropractor on a Hotel manager. She denies fevers, chills, nausea, vomiting, or changes in bowel habits. She denies shortness of breath, chest pain, cough, or weakness. Her right shoulder hurts from a history of bursitis. Her right foot is sore from plantar fasciitis for which she will be making an appointment with a podiatrist. She states she does get somewhat depressed for weeks at a time, then suddenly she "snaps out of it." She desires to be "evened out." She is active in her church but she states sometimes she loses interest in activities she used to enjoy. She had trouble sleeping some nights. She denies anxiety or suicidal thoughts. A detailed review of systems is otherwise noncontributory.   PAST MEDICAL HISTORY: Past Medical History  Diagnosis Date  . Hypertension   . Hyperlipidemia   The past medical history is significant for osteopenia, history of cervical cancer status post simple hysterectomy without salpingo oophorectomy at the age of 80, history of osteoarthritis especially involving the right shoulder where she says she has a fairly chronic bursitis, hypercholesterolemia, history of frequent urinary tract infections,  history of appendectomy, history of cataracts, not yet surgically removed (she tells me Dr. Katy Fitch is planning to do this in January), and history of "an enlarged heart" although this was not seen in the most recent chest x-ray.    PAST SURGICAL HISTORY: No  past surgical history on file.  FAMILY HISTORY The patient's mother died with bladder cancer at the age of 33.  The patient's father died with lung cancer at the age of 86.  She had one brother who died in an automobile accident. One sister is "okay".  There is no breast or ovarian cancer in the family except for the patient's older daughter, [name], who lives in [place], and was diagnosed with breast cancer at the age of 40. She was treated at [facility].   GYNECOLOGIC HISTORY: GX P5.  First pregnancy to term at age 76.  Hysterectomy at age 59.  She took Premarin for many years, stopping about ten years ago.   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  She has been divorced twice, lives home alone, has no pets.  Son, [name], works in a Psychologist, prison and probation services company here in [place], as does son, [name].  Daughter, [name] in [place] had breast cancer. Daughter,[name] is also my patient and works at [employer] here in [place]. Daughter, [name], works in Engineer, technical sales in [place], [place].  The patient has nine grandchildren and three great-grandchildren.  She attends [employer].     ADVANCED DIRECTIVES:  HEALTH MAINTENANCE: History  Substance Use Topics  . Smoking status: Never Smoker   . Smokeless tobacco: Never Used  . Alcohol Use: No     Colonoscopy:  PAP:  Bone density:  Lipid panel:  Allergies  Allergen Reactions  . Morphine And Related     vomiting    Current Outpatient Prescriptions  Medication Sig Dispense Refill  . ACTONEL 35 MG tablet TAKE 1 TABLET BY MOUTH WEEKLY  12 tablet  1  . aspirin 81 MG tablet Take 81 mg by mouth daily.       . metoprolol succinate (TOPROL-XL) 25 MG 24 hr tablet Take 1/2 tablet daily  45 tablet  3  . naproxen sodium (ANAPROX) 220 MG tablet Take 220 mg by mouth 2 (two) times daily with a meal. Pt  takes medication for  plantar fascitis.      . rosuvastatin (CRESTOR) 5 MG tablet TAKE 1 TABLET ON MONDAY, WEDNESDAY, AND FRIDAY  104 tablet  0  . telmisartan-hydrochlorothiazide (MICARDIS HCT) 80-12.5 MG per tablet TAKE 1/2 TABLET BY MOUTH EVERY DAY       No current facility-administered medications for this visit.    OBJECTIVE: Elderly white woman in no acute distress Filed Vitals:   05/07/14 1425  BP: 130/51  Pulse: 62  Temp: 98.1 F (36.7 C)  Resp: 20     Body mass index is 25.84 kg/(m^2).    ECOG FS: 0  Skin: warm, dry  HEENT: sclerae anicteric, conjunctivae pink, oropharynx clear. No thrush or mucositis.  Lymph Nodes: No cervical or supraclavicular lymphadenopathy  Lungs: clear to auscultation bilaterally, no rales, wheezes, or rhonci  Heart: regular rate and rhythm  Abdomen: round, soft, non tender, positive bowel sounds  Musculoskeletal: No focal spinal tenderness, no peripheral edema  Neuro: non focal, well oriented, positive affect  Breasts: right breast status post lumpectomy and  radiation. No evidence of recurrent disease. Right axilla benign. Left breast unremarkable.   LAB RESULTS: Lab Results  Component Value Date   WBC 4.0 04/30/2014   NEUTROABS 2.1 04/30/2014   HGB 11.8 04/30/2014   HCT 34.6* 04/30/2014   MCV 95.8 04/30/2014   PLT 169 04/30/2014      Chemistry      Component Value Date/Time   NA 140 04/30/2014 0919   NA 137 03/18/2012 1433   K 4.2 04/30/2014 0919   K 4.5 03/18/2012 1433   CL 101 03/18/2012 1433   CO2 26 04/30/2014 0919   CO2 30 03/18/2012 1433   BUN 11.6 04/30/2014 0919   BUN 17 03/18/2012 1433   CREATININE 1.0 04/30/2014 0919   CREATININE 1.23* 03/18/2012 1433      Component Value Date/Time   CALCIUM 9.3 04/30/2014 0919   CALCIUM 9.7 03/18/2012 1433   ALKPHOS 73 04/30/2014 0919   ALKPHOS 68 03/18/2012 1433   AST 19 04/30/2014 0919   AST 14 03/18/2012 1433   ALT 12 04/30/2014 0919   ALT 10 03/18/2012 1433   BILITOT 0.78 04/30/2014 0919   BILITOT 0.7  03/18/2012 1433       Lab Results  Component Value Date   LABCA2 21 03/18/2012    No components found with this basename: [id number]    No results found for this basename: INR,  in the last 168 hours  Urinalysis No results found for this basename: colorurine,  appearanceur,  labspec,  phurine,  glucoseu,  hgbur,  bilirubinur,  ketonesur,  proteinur,  urobilinogen,  nitrite,  leukocytesur    STUDIES: Mammography this year is overdue  ASSESSMENT: 80 y.o. BRCA negative [place] woman  (1) status post double right-sided lumpectomies August 2011 for a T1c and T1b N0, stage IA invasive ductal carcinomas, both strongly estrogen and progesterone receptor positive, HER2/neu negative, with low proliferation fractions.   (2) completed radiation therapy October 2011  (3) refused adjuvant antiestrogens.    PLAN:  Jakeya is doing well, now 4 years out from her definitive surgery. The labs were reviewed in detail with her and were normal. Her mammogram for this year is overdue so we have scheduled one at [facility] for next week.   We discussed Sharene's bout with depressive thoughts. She is hesitant to use anti-depressant medications. I offered to refer her to pastoral care to talk about some of these feelings. She was agreeable to this idea. She admits she feels better when she is more active so I encouraged her to exercise more and perhaps join the [employer] program, where some of her friends are already participating.  Matina will return next year for labs and an office visit, which will also be her 5th year of follow up. At this time we will likely release her to her PCP. She understands and agrees with this plan. She has been encouraged to call with any issues that arise before her next visit here.   Marcelino Duster    05/07/2014

## 2014-05-11 DIAGNOSIS — Z853 Personal history of malignant neoplasm of breast: Secondary | ICD-10-CM | POA: Diagnosis not present

## 2014-05-13 ENCOUNTER — Telehealth: Payer: Self-pay | Admitting: Oncology

## 2014-07-22 ENCOUNTER — Other Ambulatory Visit: Payer: Self-pay | Admitting: Cardiology

## 2014-08-23 ENCOUNTER — Other Ambulatory Visit: Payer: Self-pay | Admitting: Cardiology

## 2014-08-24 NOTE — Telephone Encounter (Signed)
She should get this type of medication from her PCP

## 2014-08-30 DIAGNOSIS — I10 Essential (primary) hypertension: Secondary | ICD-10-CM | POA: Diagnosis not present

## 2014-08-30 DIAGNOSIS — M791 Myalgia: Secondary | ICD-10-CM | POA: Diagnosis not present

## 2014-08-30 DIAGNOSIS — R1011 Right upper quadrant pain: Secondary | ICD-10-CM | POA: Diagnosis not present

## 2014-09-14 DIAGNOSIS — R1011 Right upper quadrant pain: Secondary | ICD-10-CM | POA: Diagnosis not present

## 2014-09-29 DIAGNOSIS — Z961 Presence of intraocular lens: Secondary | ICD-10-CM | POA: Diagnosis not present

## 2014-09-29 DIAGNOSIS — H04123 Dry eye syndrome of bilateral lacrimal glands: Secondary | ICD-10-CM | POA: Diagnosis not present

## 2014-09-29 DIAGNOSIS — H43393 Other vitreous opacities, bilateral: Secondary | ICD-10-CM | POA: Diagnosis not present

## 2014-09-29 DIAGNOSIS — H26492 Other secondary cataract, left eye: Secondary | ICD-10-CM | POA: Diagnosis not present

## 2014-10-02 NOTE — Telephone Encounter (Signed)
She should get that from her PCP

## 2014-11-25 ENCOUNTER — Other Ambulatory Visit: Payer: Self-pay | Admitting: Cardiology

## 2014-11-25 NOTE — Telephone Encounter (Signed)
The patient should get this type of drug from her primary care provider

## 2015-01-03 ENCOUNTER — Other Ambulatory Visit: Payer: Self-pay | Admitting: Cardiology

## 2015-03-02 DIAGNOSIS — E780 Pure hypercholesterolemia: Secondary | ICD-10-CM | POA: Diagnosis not present

## 2015-03-02 DIAGNOSIS — Z0000 Encounter for general adult medical examination without abnormal findings: Secondary | ICD-10-CM | POA: Diagnosis not present

## 2015-03-02 DIAGNOSIS — I10 Essential (primary) hypertension: Secondary | ICD-10-CM | POA: Diagnosis not present

## 2015-03-02 DIAGNOSIS — R5383 Other fatigue: Secondary | ICD-10-CM | POA: Diagnosis not present

## 2015-03-02 DIAGNOSIS — Z1389 Encounter for screening for other disorder: Secondary | ICD-10-CM | POA: Diagnosis not present

## 2015-03-07 DIAGNOSIS — K219 Gastro-esophageal reflux disease without esophagitis: Secondary | ICD-10-CM | POA: Diagnosis not present

## 2015-03-07 DIAGNOSIS — I10 Essential (primary) hypertension: Secondary | ICD-10-CM | POA: Diagnosis not present

## 2015-03-07 DIAGNOSIS — E782 Mixed hyperlipidemia: Secondary | ICD-10-CM | POA: Diagnosis not present

## 2015-03-07 DIAGNOSIS — M810 Age-related osteoporosis without current pathological fracture: Secondary | ICD-10-CM | POA: Diagnosis not present

## 2015-05-11 ENCOUNTER — Telehealth: Payer: Self-pay | Admitting: Oncology

## 2015-05-11 ENCOUNTER — Other Ambulatory Visit (HOSPITAL_BASED_OUTPATIENT_CLINIC_OR_DEPARTMENT_OTHER): Payer: Medicare Other

## 2015-05-11 ENCOUNTER — Ambulatory Visit (HOSPITAL_BASED_OUTPATIENT_CLINIC_OR_DEPARTMENT_OTHER): Payer: Medicare Other | Admitting: Oncology

## 2015-05-11 VITALS — BP 153/58 | HR 54 | Temp 98.2°F | Resp 17 | Ht 62.0 in | Wt 141.4 lb

## 2015-05-11 DIAGNOSIS — C50911 Malignant neoplasm of unspecified site of right female breast: Secondary | ICD-10-CM

## 2015-05-11 DIAGNOSIS — Z853 Personal history of malignant neoplasm of breast: Secondary | ICD-10-CM | POA: Diagnosis not present

## 2015-05-11 DIAGNOSIS — C50919 Malignant neoplasm of unspecified site of unspecified female breast: Secondary | ICD-10-CM

## 2015-05-11 LAB — COMPREHENSIVE METABOLIC PANEL (CC13)
ALBUMIN: 4 g/dL (ref 3.5–5.0)
ALK PHOS: 121 U/L (ref 40–150)
ALT: 17 U/L (ref 0–55)
ANION GAP: 8 meq/L (ref 3–11)
AST: 17 U/L (ref 5–34)
BUN: 18.5 mg/dL (ref 7.0–26.0)
CALCIUM: 10 mg/dL (ref 8.4–10.4)
CO2: 28 mEq/L (ref 22–29)
CREATININE: 1.1 mg/dL (ref 0.6–1.1)
Chloride: 105 mEq/L (ref 98–109)
EGFR: 49 mL/min/{1.73_m2} — ABNORMAL LOW (ref >90)
Glucose: 93 mg/dl (ref 70–140)
Potassium: 5.1 mEq/L (ref 3.5–5.1)
Sodium: 140 mEq/L (ref 136–145)
Total Bilirubin: 0.6 mg/dL (ref 0.20–1.20)
Total Protein: 7.2 g/dL (ref 6.4–8.3)

## 2015-05-11 LAB — CBC WITH DIFFERENTIAL/PLATELET
BASO%: 0.6 % (ref 0.0–2.0)
Basophils Absolute: 0 10*3/uL (ref 0.0–0.1)
EOS%: 2.5 % (ref 0.0–7.0)
Eosinophils Absolute: 0.1 10*3/uL (ref 0.0–0.5)
HEMATOCRIT: 36.9 % (ref 34.8–46.6)
HEMOGLOBIN: 12.7 g/dL (ref 11.6–15.9)
LYMPH#: 2 10*3/uL (ref 0.9–3.3)
LYMPH%: 36.5 % (ref 14.0–49.7)
MCH: 33 pg (ref 25.1–34.0)
MCHC: 34.3 g/dL (ref 31.5–36.0)
MCV: 96.4 fL (ref 79.5–101.0)
MONO#: 0.6 10*3/uL (ref 0.1–0.9)
MONO%: 10.2 % (ref 0.0–14.0)
NEUT#: 2.7 10*3/uL (ref 1.5–6.5)
NEUT%: 50.2 % (ref 38.4–76.8)
PLATELETS: 180 10*3/uL (ref 145–400)
RBC: 3.83 10*6/uL (ref 3.70–5.45)
RDW: 12.4 % (ref 11.2–14.5)
WBC: 5.4 10*3/uL (ref 3.9–10.3)

## 2015-05-11 NOTE — Telephone Encounter (Signed)
Appointments made and avs printed for patient and her mammo is at [facility] 05/24/15 12;45 and order faxed

## 2015-05-11 NOTE — Progress Notes (Signed)
ID: Su Monks   DOB: 1933/03/19  MR#: [id number]  QIW#:[id number]  CHIEF COMPLAINT: right breast cancer CURRENT TREATMENT: observation  BREAST CANCER HISTORY:  She was feeling fine, and had no particular symptoms when she had routine mammographic screening at [facility] March 02, 2010.  Her prior mammogram had been in 2008.  This time Dr. Isaiah Blakes noted an irregular area of focal asymmetry in the upper outer quadrant of the right breast, and the patient was brought back for a diagnostic mammography and right breast ultrasonography March 13, 2010.  This confirmed an irregular ill defined, mixed density/hyperdense mass in the right breast with some spiculation and distortion, which by ultrasound measured 1.2 cm.  There was also a smaller, though otherwise similar lesion more posteriorly, and that one measured 8 mm by ultrasound.  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  Both were estrogen and progesterone receptor positive and HER2-, and both had a low proliferation fraction, although morphologically there was some difference between the masses, so that at the Multidisciplinary Breast Cancer Conference this morning, it was felt most likely this were two concurrent primaries.  With this information the patient was referred to Dr. Donne Hazel, and bilateral breast MRIs were obtained January 7th.  This showed the larger mass to be 1.4 cm, the smaller one to measure 1.1 cm, but there was no other finding of significance in either breast, and there was no evidence of adenopathy.   Accordingly, the patient proceeded to definitive lumpectomies, which were performed August 17.  The final pathology from this procedure ([id number]) showed two invasive ductal carcinomas, the larger measuring 1.4 cm, the smaller 7 mm, again both ER/PR+, and HER2-.  Both were grade 1.  Margins were close, although negative, and there was  no evidence of lymphovascular invasion.  Zero of two sentinel lymph nodes were involved.  The patient is staged at T1c N0, or stage I.  Her subsequent history is as detailed below.  INTERVAL HISTORY: Tammy Oneill returns for follow up of her breast cancer. She refused anti-estrogen therapy in 2011 and has only been followed up with observation. 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   REVIEW OF SYSTEMS: Tammy Oneill does not exercise regularly but does her own yard work with a Chiropractor on a Hotel manager. She denies fevers, chills, nausea, vomiting, or changes in bowel habits. She denies shortness of breath, chest pain, cough, or weakness. Her right shoulder hurts from a history of bursitis. Her right foot is sore from plantar fasciitis for which she will be making an appointment with a podiatrist. She states she does get somewhat depressed for weeks at a time, then suddenly she "snaps out of it." She desires to be "evened out." She is active in her church but she states sometimes she loses interest in activities she used to enjoy. She had trouble sleeping some nights. She denies anxiety or suicidal thoughts. A detailed review of systems is otherwise noncontributory.   PAST MEDICAL HISTORY: Past Medical History  Diagnosis Date  . Hypertension   . Hyperlipidemia   The past medical history is significant for osteopenia, history of cervical cancer status post simple hysterectomy without salpingo oophorectomy at the age of 15, history of osteoarthritis especially involving the right shoulder where she says she has a fairly chronic bursitis, hypercholesterolemia, history of frequent urinary tract infections,  history of appendectomy, history of cataracts, not yet surgically removed (she tells me Dr. Katy Fitch is planning to do this in January), and history of "an enlarged heart" although this was not seen in the most recent chest x-ray.    PAST SURGICAL HISTORY: No  past surgical history on file.  FAMILY HISTORY The patient's mother died with bladder cancer at the age of 90.  The patient's father died with lung cancer at the age of 66.  She had one brother who died in an automobile accident. One sister is "okay".  There is no breast or ovarian cancer in the family except for the patient's older daughter, Tammy Oneill, who lives in [place], and was diagnosed with breast cancer at the age of 10. She was treated at [facility].   GYNECOLOGIC HISTORY: GX P5.  First pregnancy to term at age 7.  Hysterectomy at age 82.  She took Premarin for many years, stopping about ten years ago.   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  She has been divorced twice, lives home alone, has no pets.  Son, Tammy Oneill, works in a Psychologist, prison and probation services company here in [place], as does son, Tammy Oneill..  Daughter, Tammy Oneill in [place] had breast cancer. Daughter,Tammy Oneill is also my patient and works at [employer] here in [place]. Daughter, Tammy Oneill, works in Engineer, technical sales in [place], [place].  The patient has nine grandchildren and three great-grandchildren.  She attends [employer].     ADVANCED DIRECTIVES:  HEALTH MAINTENANCE: Social History  Substance Use Topics  . Smoking status: Never Smoker   . Smokeless tobacco: Never Used  . Alcohol Use: No     Colonoscopy:  PAP:  Bone density:  Lipid panel:  Allergies  Allergen Reactions  . Morphine And Related     vomiting    Current Outpatient Prescriptions  Medication Sig Dispense Refill  . ACTONEL 35 MG tablet TAKE 1 TABLET BY MOUTH WEEKLY 12 tablet 1  . aspirin 81 MG tablet Take 81 mg by mouth daily.     . CRESTOR 5 MG tablet TAKE 1 TABLET ON MONDAY, WEDNESDAY, AND FRIDAY 104 tablet 0  . metoprolol succinate (TOPROL-XL) 25 MG 24 hr tablet Take 1/2 tablet daily 45 tablet 3  . naproxen  sodium (ANAPROX) 220 MG tablet Take 220 mg by mouth 2 (two) times daily with a meal. Pt takes medication for  plantar fascitis.    Marland Kitchen telmisartan-hydrochlorothiazide (MICARDIS HCT) 80-12.5 MG per tablet TAKE 1/2 TABLET BY MOUTH EVERY DAY    . telmisartan-hydrochlorothiazide (MICARDIS HCT) 80-12.5 MG per tablet TAKE 1 TABLET BY MOUTH EVERY DAY 90 tablet 3   No current facility-administered medications for this visit.    OBJECTIVE: Elderly white woman in no acute distress Filed Vitals:   05/11/15 1358  BP: 153/58  Pulse: 54  Temp: 98.2 F (36.8 C)  Resp: 17     Body mass index is 25.86 kg/(m^2).    ECOG FS: 0  Skin: warm, dry  HEENT: sclerae anicteric, conjunctivae pink, oropharynx clear. No thrush or mucositis.  Lymph Nodes: No cervical or supraclavicular lymphadenopathy  Lungs: clear to auscultation bilaterally, no rales, wheezes, or rhonci  Heart: regular rate and rhythm  Abdomen: round, soft, non tender, positive bowel sounds  Musculoskeletal: No focal spinal tenderness, no peripheral edema  Neuro: non focal, well oriented, positive affect  Breasts: right breast status post lumpectomy and radiation. No evidence of recurrent disease. Right axilla benign. Left breast unremarkable.   LAB RESULTS: Lab Results  Component Value Date   WBC 5.4 05/11/2015   NEUTROABS 2.7 05/11/2015   HGB 12.7 05/11/2015   HCT 36.9 05/11/2015   MCV 96.4 05/11/2015   PLT 180 05/11/2015      Chemistry      Component Value Date/Time   NA 140 05/11/2015 1323   NA 137 03/18/2012 1433   K 5.1 05/11/2015 1323   K 4.5 03/18/2012 1433   CL 101 03/18/2012 1433   CO2 28 05/11/2015 1323   CO2 30 03/18/2012 1433   BUN 18.5 05/11/2015 1323   BUN 17 03/18/2012 1433   CREATININE 1.1 05/11/2015 1323   CREATININE 1.23* 03/18/2012 1433      Component Value Date/Time   CALCIUM 10.0 05/11/2015 1323   CALCIUM 9.7 03/18/2012 1433   ALKPHOS 121 05/11/2015 1323   ALKPHOS 68 03/18/2012 1433   AST 17  05/11/2015 1323   AST 14 03/18/2012 1433   ALT 17 05/11/2015 1323   ALT 10 03/18/2012 1433   BILITOT 0.60 05/11/2015 1323   BILITOT 0.7 03/18/2012 1433       Lab Results  Component Value Date   LABCA2 21 03/18/2012    No components found for: [id number]  No results for input(s): INR in the last 168 hours.  Urinalysis No results found for: COLORURINE  STUDIES: Mammography this year is overdue  ASSESSMENT: 81 y.o. BRCA negative [place] woman  (1) status post double right-sided lumpectomies August 2011 for a T1c and T1b N0, stage IA invasive ductal carcinomas, both strongly estrogen and progesterone receptor positive, HER2/neu negative, with low proliferation fractions.   (2) completed radiation therapy October 2011  (3) refused adjuvant antiestrogens.    PLAN:  Desree is 5 years out from her definitive surgery for breast cancer with no evidence of disease recurrence. This is very favorable. At this point uncomfortable releasing her back to her primary care physician.  We do have a new survivorship program and I think she would be an excellent candidate for it. I have gone ahead and made her an appointment with her breast survivorship and a practitioner for next year, after her next mammogram.  Incidentally she was late for her mammography again this year. I have ordered that for next week.  As far as breast cancer follow-up is concerned all she will need is yearly mammography and a yearly physician breast exam.  I will be glad to see Fani again at any point in the future if I when the need arises, but as of now I have making her no further routine appointments with me here.  Tammy Oneill C    05/11/2015

## 2015-05-11 NOTE — Progress Notes (Signed)
ID: Tammy Oneill   DOB: March 21, 1933  MR#: [id number]  DJM#:[id number]  CHIEF COMPLAINT: right breast cancer CURRENT TREATMENT: observation  BREAST CANCER HISTORY:  Tammy Oneill was feeling fine, and had no particular symptoms when Tammy Oneill had routine mammographic screening at [facility] March 02, 2010.  Her prior mammogram had been in 2008.  This time Dr. Isaiah Blakes noted an irregular area of focal asymmetry in the upper outer quadrant of the right breast, and the patient was brought back for a diagnostic mammography and right breast ultrasonography March 13, 2010.  This confirmed an irregular ill defined, mixed density/hyperdense mass in the right breast with some spiculation and distortion, which by ultrasound measured 1.2 cm.  There was also a smaller, though otherwise similar lesion more posteriorly, and that one measured 8 mm by ultrasound.  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  Both were estrogen and progesterone receptor positive and HER2-, and both had a low proliferation fraction, although morphologically there was some difference between the masses, so that at the Multidisciplinary Breast Cancer Conference this morning, it was felt most likely this were two concurrent primaries.  With this information the patient was referred to Dr. Donne Hazel, and bilateral breast MRIs were obtained January 7th.  This showed the larger mass to be 1.4 cm, the smaller one to measure 1.1 cm, but there was no other finding of significance in either breast, and there was no evidence of adenopathy.   Accordingly, the patient proceeded to definitive lumpectomies, which were performed August 17.  The final pathology from this procedure ([id number]) showed two invasive ductal carcinomas, the larger measuring 1.4 cm, the smaller 7 mm, again both ER/PR+, and HER2-.  Both were grade 1.  Margins were close, although negative, and there was  no evidence of lymphovascular invasion.  Zero of two sentinel lymph nodes were involved.  The patient is staged at T1c N0, or stage I.  Her subsequent history is as detailed below.  INTERVAL HISTORY: Tammy Oneill returns for follow up of her breast cancer. Tammy Oneill refused anti-estrogen therapy in 2011 and has only been followed up with observation. Tammy Oneill does not exercise regularly but does her own yard work with a Chiropractor on a Hotel manager.   REVIEW OF SYSTEMS: Tammy Oneill does not exercise regularly but does her own yard work with a Chiropractor on a Hotel manager. Tammy Oneill denies fevers, chills, nausea, vomiting, or changes in bowel habits. Tammy Oneill denies shortness of breath, chest pain, cough, or weakness. Her right shoulder hurts from a history of bursitis. Her right foot is sore from plantar fasciitis for which Tammy Oneill will be making an appointment with a podiatrist. Tammy Oneill states Tammy Oneill does get somewhat depressed for weeks at a time, then suddenly Tammy Oneill "snaps out of it." Tammy Oneill desires to be "evened out." Tammy Oneill is active in her church but Tammy Oneill states sometimes Tammy Oneill loses interest in activities Tammy Oneill used to enjoy. Tammy Oneill had trouble sleeping some nights. Tammy Oneill denies anxiety or suicidal thoughts. A detailed review of systems is otherwise noncontributory.   PAST MEDICAL HISTORY: Past Medical History  Diagnosis Date  . Hypertension   . Hyperlipidemia   The past medical history is significant for osteopenia, history of cervical cancer status post simple hysterectomy without salpingo oophorectomy at the age of 66, history of osteoarthritis especially involving the right shoulder where Tammy Oneill says Tammy Oneill has a fairly chronic bursitis, hypercholesterolemia, history of frequent urinary tract infections,  history of appendectomy, history of cataracts, not yet surgically removed (Tammy Oneill tells me Dr. Katy Fitch is planning to do this in January), and history of "an enlarged heart" although this was not seen in the most recent chest x-ray.    PAST SURGICAL HISTORY: No  past surgical history on file.  FAMILY HISTORY The patient's mother died with bladder cancer at the age of 4.  The patient's father died with lung cancer at the age of 25.  Tammy Oneill had one brother who died in an automobile accident. One sister is "okay".  There is no breast or ovarian cancer in the family except for the patient's older daughter, Tammy Oneill, who lives in [place], and was diagnosed with breast cancer at the age of 37. Tammy Oneill was treated at [facility].   GYNECOLOGIC HISTORY: GX P5.  First pregnancy to term at age 45.  Hysterectomy at age 13.  Tammy Oneill took Premarin for many years, stopping about ten years ago.   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  Tammy Oneill has been divorced twice, lives home alone, has no pets.  Son, Tammy Oneill, works in a Psychologist, prison and probation services company here in [place], as does son, Tammy Oneill..  Daughter, Tammy Oneill in [place] had breast cancer. Daughter,Tammy Oneill is also my patient and works at [employer] here in [place]. Daughter, Tammy Oneill, works in Engineer, technical sales in [place], [place].  The patient has nine grandchildren and three great-grandchildren.  Tammy Oneill attends [employer].     ADVANCED DIRECTIVES:  HEALTH MAINTENANCE: Social History  Substance Use Topics  . Smoking status: Never Smoker   . Smokeless tobacco: Never Used  . Alcohol Use: No     Colonoscopy:  PAP:  Bone density:  Lipid panel:  Allergies  Allergen Reactions  . Morphine And Related     vomiting    Current Outpatient Prescriptions  Medication Sig Dispense Refill  . ACTONEL 35 MG tablet TAKE 1 TABLET BY MOUTH WEEKLY 12 tablet 1  . aspirin 81 MG tablet Take 81 mg by mouth daily.     . metoprolol succinate (TOPROL-XL) 25 MG 24 hr tablet Take 1/2 tablet daily 45 tablet 3  . naproxen sodium (ANAPROX) 220 MG tablet Take 220 mg by mouth 2 (two) times daily with a meal. Pt  takes medication for  plantar fascitis.    Marland Kitchen telmisartan-hydrochlorothiazide (MICARDIS HCT) 80-12.5 MG per tablet TAKE 1/2 TABLET BY MOUTH EVERY DAY     No current facility-administered medications for this visit.    OBJECTIVE: Elderly white woman in no acute distress Filed Vitals:   05/11/15 1358  BP: 153/58  Pulse: 54  Temp: 98.2 F (36.8 C)  Resp: 17     Body mass index is 25.86 kg/(m^2).    ECOG FS: 0  Skin: warm, dry  HEENT: sclerae anicteric, conjunctivae pink, oropharynx clear. No thrush or mucositis.  Lymph Nodes: No cervical or supraclavicular lymphadenopathy  Lungs: clear to auscultation bilaterally, no rales, wheezes, or rhonci  Heart: regular rate and rhythm  Abdomen: round, soft, non tender, positive bowel sounds  Musculoskeletal: No focal spinal tenderness, no peripheral edema  Neuro: non focal, well oriented, positive affect  Breasts: right breast status post lumpectomy and radiation. No evidence of recurrent disease. Right axilla benign. Left breast unremarkable.   LAB RESULTS: Lab Results  Component Value Date   WBC 5.4 05/11/2015  NEUTROABS 2.7 05/11/2015   HGB 12.7 05/11/2015   HCT 36.9 05/11/2015   MCV 96.4 05/11/2015   PLT 180 05/11/2015      Chemistry      Component Value Date/Time   NA 140 05/11/2015 1323   NA 137 03/18/2012 1433   K 5.1 05/11/2015 1323   K 4.5 03/18/2012 1433   CL 101 03/18/2012 1433   CO2 28 05/11/2015 1323   CO2 30 03/18/2012 1433   BUN 18.5 05/11/2015 1323   BUN 17 03/18/2012 1433   CREATININE 1.1 05/11/2015 1323   CREATININE 1.23* 03/18/2012 1433      Component Value Date/Time   CALCIUM 10.0 05/11/2015 1323   CALCIUM 9.7 03/18/2012 1433   ALKPHOS 121 05/11/2015 1323   ALKPHOS 68 03/18/2012 1433   AST 17 05/11/2015 1323   AST 14 03/18/2012 1433   ALT 17 05/11/2015 1323   ALT 10 03/18/2012 1433   BILITOT 0.60 05/11/2015 1323   BILITOT 0.7 03/18/2012 1433       Lab Results  Component Value Date   LABCA2 21  03/18/2012    No components found for: [id number]  No results for input(s): INR in the last 168 hours.  Urinalysis No results found for: COLORURINE  STUDIES: Mammography this year is overdue  ASSESSMENT: 81 y.o. BRCA negative [place] woman  (1) status post double right-sided lumpectomies August 2011 for a T1c and T1b N0, stage IA invasive ductal carcinomas, both strongly estrogen and progesterone receptor positive, HER2/neu negative, with low proliferation fractions.   (2) completed radiation therapy October 2011  (3) refused adjuvant antiestrogens.    PLAN:  Ramla is doing well, now 4 years out from her definitive surgery. The labs were reviewed in detail with her and were normal. Her mammogram for this year is overdue so we have scheduled one at [facility] for next week.   We discussed Mekala's bout with depressive thoughts. Tammy Oneill is hesitant to use anti-depressant medications. I offered to refer her to pastoral care to talk about some of these feelings. Tammy Oneill was agreeable to this idea. Tammy Oneill admits Tammy Oneill feels better when Tammy Oneill is more active so I encouraged her to exercise more and perhaps join the [employer] program, where some of her friends are already participating.  Raelynne will return next year for labs and an office visit, which will also be her 5th year of follow up. At this time we will likely release her to her PCP. Tammy Oneill understands and agrees with this plan. Tammy Oneill has been encouraged to call with any issues that arise before her next visit here.   Xion Debruyne C    05/11/2015

## 2015-05-11 NOTE — Progress Notes (Signed)
The interval history and review of systems were inadvertently not updated in today's notes on Tammy Oneill so that is being addended now.   Interval history: Tammy Oneill returns today for follow-up of her breast cancer. Interval history is generally unremarkable. She is not on any medication for her breast cancer. She was supposed to have had her mammogram prior to today's visit but for some reason that has not happened.  Review of systems. Asked what her worst problem is she tells me after thinking hard that she didn't sleep for a well last night. She did get cataract surgery, that helped initially but now is a little harder for her to read. She complains of a little bit of a runny nose, occasional trouble swallowing, come disease, ankle swelling, some abdominal cramps at times, relieved by bowel movements, and joint pains here in there which make it difficult for her to walk as much as she would like to, but which are not more intense or persistent than before. She still mows her lawn with a push more all by herself. She feels forgetful and anxious but not depressed. A detailed review of systems today was otherwise stable

## 2015-05-24 DIAGNOSIS — Z1231 Encounter for screening mammogram for malignant neoplasm of breast: Secondary | ICD-10-CM | POA: Diagnosis not present

## 2015-05-30 DIAGNOSIS — I10 Essential (primary) hypertension: Secondary | ICD-10-CM | POA: Diagnosis not present

## 2015-05-30 DIAGNOSIS — E782 Mixed hyperlipidemia: Secondary | ICD-10-CM | POA: Diagnosis not present

## 2015-06-01 DIAGNOSIS — Z09 Encounter for follow-up examination after completed treatment for conditions other than malignant neoplasm: Secondary | ICD-10-CM | POA: Diagnosis not present

## 2015-06-01 DIAGNOSIS — R921 Mammographic calcification found on diagnostic imaging of breast: Secondary | ICD-10-CM | POA: Diagnosis not present

## 2015-06-01 DIAGNOSIS — N63 Unspecified lump in breast: Secondary | ICD-10-CM | POA: Diagnosis not present

## 2015-06-02 ENCOUNTER — Other Ambulatory Visit: Payer: Self-pay | Admitting: Radiology

## 2015-06-02 DIAGNOSIS — Z853 Personal history of malignant neoplasm of breast: Secondary | ICD-10-CM | POA: Diagnosis not present

## 2015-06-02 DIAGNOSIS — C50912 Malignant neoplasm of unspecified site of left female breast: Secondary | ICD-10-CM | POA: Diagnosis not present

## 2015-06-02 DIAGNOSIS — Z0000 Encounter for general adult medical examination without abnormal findings: Secondary | ICD-10-CM | POA: Diagnosis not present

## 2015-06-02 DIAGNOSIS — N63 Unspecified lump in breast: Secondary | ICD-10-CM | POA: Diagnosis not present

## 2015-06-06 DIAGNOSIS — E782 Mixed hyperlipidemia: Secondary | ICD-10-CM | POA: Diagnosis not present

## 2015-06-06 DIAGNOSIS — I10 Essential (primary) hypertension: Secondary | ICD-10-CM | POA: Diagnosis not present

## 2015-06-07 DIAGNOSIS — C50419 Malignant neoplasm of upper-outer quadrant of unspecified female breast: Secondary | ICD-10-CM | POA: Diagnosis not present

## 2015-06-08 ENCOUNTER — Encounter: Payer: Self-pay | Admitting: Genetic Counselor

## 2015-06-08 ENCOUNTER — Encounter (HOSPITAL_BASED_OUTPATIENT_CLINIC_OR_DEPARTMENT_OTHER)
Admission: RE | Admit: 2015-06-08 | Discharge: 2015-06-08 | Disposition: A | Discharge status: Home / Self Care | Payer: Medicare Other | Source: Ambulatory Visit | Attending: General Surgery | Admitting: General Surgery

## 2015-06-08 ENCOUNTER — Encounter (HOSPITAL_BASED_OUTPATIENT_CLINIC_OR_DEPARTMENT_OTHER): Payer: Self-pay | Admitting: *Deleted

## 2015-06-08 DIAGNOSIS — I10 Essential (primary) hypertension: Secondary | ICD-10-CM | POA: Diagnosis not present

## 2015-06-08 DIAGNOSIS — D0512 Intraductal carcinoma in situ of left breast: Secondary | ICD-10-CM | POA: Diagnosis not present

## 2015-06-08 DIAGNOSIS — F419 Anxiety disorder, unspecified: Secondary | ICD-10-CM | POA: Diagnosis not present

## 2015-06-08 DIAGNOSIS — Z170 Estrogen receptor positive status [ER+]: Secondary | ICD-10-CM | POA: Diagnosis not present

## 2015-06-08 DIAGNOSIS — Z8541 Personal history of malignant neoplasm of cervix uteri: Secondary | ICD-10-CM | POA: Diagnosis not present

## 2015-06-08 DIAGNOSIS — Z90710 Acquired absence of both cervix and uterus: Secondary | ICD-10-CM | POA: Diagnosis not present

## 2015-06-08 DIAGNOSIS — Z85820 Personal history of malignant melanoma of skin: Secondary | ICD-10-CM | POA: Diagnosis not present

## 2015-06-08 DIAGNOSIS — Z1379 Encounter for other screening for genetic and chromosomal anomalies: Secondary | ICD-10-CM | POA: Insufficient documentation

## 2015-06-08 DIAGNOSIS — Z7982 Long term (current) use of aspirin: Secondary | ICD-10-CM | POA: Diagnosis not present

## 2015-06-08 DIAGNOSIS — K219 Gastro-esophageal reflux disease without esophagitis: Secondary | ICD-10-CM | POA: Diagnosis not present

## 2015-06-08 DIAGNOSIS — C50912 Malignant neoplasm of unspecified site of left female breast: Secondary | ICD-10-CM | POA: Diagnosis present

## 2015-06-08 DIAGNOSIS — Z0000 Encounter for general adult medical examination without abnormal findings: Secondary | ICD-10-CM | POA: Diagnosis not present

## 2015-06-08 DIAGNOSIS — Z885 Allergy status to narcotic agent status: Secondary | ICD-10-CM | POA: Diagnosis not present

## 2015-06-09 ENCOUNTER — Ambulatory Visit (HOSPITAL_BASED_OUTPATIENT_CLINIC_OR_DEPARTMENT_OTHER): Payer: Medicare Other | Admitting: Anesthesiology

## 2015-06-09 ENCOUNTER — Ambulatory Visit (HOSPITAL_BASED_OUTPATIENT_CLINIC_OR_DEPARTMENT_OTHER)
Admission: RE | Admit: 2015-06-09 | Discharge: 2015-06-09 | Disposition: A | Discharge status: Home / Self Care | Payer: Medicare Other | Source: Ambulatory Visit | Attending: General Surgery | Admitting: General Surgery

## 2015-06-09 ENCOUNTER — Encounter (HOSPITAL_BASED_OUTPATIENT_CLINIC_OR_DEPARTMENT_OTHER): Payer: Self-pay

## 2015-06-09 ENCOUNTER — Encounter (HOSPITAL_BASED_OUTPATIENT_CLINIC_OR_DEPARTMENT_OTHER)
Admission: RE | Disposition: A | Discharge status: Home / Self Care | Payer: Self-pay | Source: Ambulatory Visit | Attending: General Surgery

## 2015-06-09 DIAGNOSIS — Z85820 Personal history of malignant melanoma of skin: Secondary | ICD-10-CM | POA: Insufficient documentation

## 2015-06-09 DIAGNOSIS — Z885 Allergy status to narcotic agent status: Secondary | ICD-10-CM | POA: Insufficient documentation

## 2015-06-09 DIAGNOSIS — F419 Anxiety disorder, unspecified: Secondary | ICD-10-CM | POA: Insufficient documentation

## 2015-06-09 DIAGNOSIS — D0512 Intraductal carcinoma in situ of left breast: Secondary | ICD-10-CM | POA: Diagnosis not present

## 2015-06-09 DIAGNOSIS — Z8541 Personal history of malignant neoplasm of cervix uteri: Secondary | ICD-10-CM | POA: Diagnosis not present

## 2015-06-09 DIAGNOSIS — Z90710 Acquired absence of both cervix and uterus: Secondary | ICD-10-CM | POA: Diagnosis not present

## 2015-06-09 DIAGNOSIS — C50912 Malignant neoplasm of unspecified site of left female breast: Secondary | ICD-10-CM | POA: Diagnosis not present

## 2015-06-09 DIAGNOSIS — I10 Essential (primary) hypertension: Secondary | ICD-10-CM | POA: Diagnosis not present

## 2015-06-09 DIAGNOSIS — Z7982 Long term (current) use of aspirin: Secondary | ICD-10-CM | POA: Diagnosis not present

## 2015-06-09 DIAGNOSIS — K219 Gastro-esophageal reflux disease without esophagitis: Secondary | ICD-10-CM | POA: Insufficient documentation

## 2015-06-09 DIAGNOSIS — Z170 Estrogen receptor positive status [ER+]: Secondary | ICD-10-CM | POA: Insufficient documentation

## 2015-06-09 DIAGNOSIS — Z0000 Encounter for general adult medical examination without abnormal findings: Secondary | ICD-10-CM | POA: Diagnosis not present

## 2015-06-09 HISTORY — DX: Nausea with vomiting, unspecified: R11.2

## 2015-06-09 HISTORY — DX: Other specified postprocedural states: Z98.890

## 2015-06-09 HISTORY — DX: Gastro-esophageal reflux disease without esophagitis: K21.9

## 2015-06-09 HISTORY — DX: Other specified disorders of bone density and structure, unspecified site: M85.80

## 2015-06-09 HISTORY — DX: Anxiety disorder, unspecified: F41.9

## 2015-06-09 HISTORY — PX: BREAST LUMPECTOMY WITH RADIOACTIVE SEED LOCALIZATION: SHX6424

## 2015-06-09 SURGERY — BREAST LUMPECTOMY WITH RADIOACTIVE SEED LOCALIZATION
Anesthesia: General | Site: Breast | Laterality: Left

## 2015-06-09 MED ORDER — FENTANYL CITRATE (PF) 100 MCG/2ML IJ SOLN
INTRAMUSCULAR | Status: AC
  Filled 2015-06-09: qty 4

## 2015-06-09 MED ORDER — PROPOFOL 10 MG/ML IV BOLUS
INTRAVENOUS | Status: DC | PRN
  Administered 2015-06-09: 150 mg via INTRAVENOUS

## 2015-06-09 MED ORDER — CEFAZOLIN (ANCEF) 1 G IV SOLR
2.0000 g | INTRAVENOUS | Status: AC
  Administered 2015-06-09: 2 g

## 2015-06-09 MED ORDER — HYDROCODONE-ACETAMINOPHEN 10-325 MG PO TABS: 1.0000 | ORAL_TABLET | Freq: Four times a day (QID) | ORAL | Status: DC | PRN

## 2015-06-09 MED ORDER — BUPIVACAINE HCL (PF) 0.25 % IJ SOLN
INTRAMUSCULAR | Status: DC | PRN
  Administered 2015-06-09: 10 mL

## 2015-06-09 MED ORDER — PHENYLEPHRINE 40 MCG/ML (10ML) SYRINGE FOR IV PUSH (FOR BLOOD PRESSURE SUPPORT)
PREFILLED_SYRINGE | INTRAVENOUS | Status: AC
  Filled 2015-06-09: qty 10

## 2015-06-09 MED ORDER — PROPOFOL INFUSION 10 MG/ML OPTIME
INTRAVENOUS | Status: DC | PRN
  Administered 2015-06-09: 50 ug/kg/min via INTRAVENOUS

## 2015-06-09 MED ORDER — DEXAMETHASONE SODIUM PHOSPHATE 4 MG/ML IJ SOLN
INTRAMUSCULAR | Status: DC | PRN
  Administered 2015-06-09: 6 mg via INTRAVENOUS

## 2015-06-09 MED ORDER — MIDAZOLAM HCL 2 MG/2ML IJ SOLN: 1.0000 mg | INTRAMUSCULAR | Status: DC | PRN

## 2015-06-09 MED ORDER — GLYCOPYRROLATE 0.2 MG/ML IJ SOLN: 0.2000 mg | Freq: Once | INTRAMUSCULAR | Status: DC | PRN

## 2015-06-09 MED ORDER — PROPOFOL 500 MG/50ML IV EMUL
INTRAVENOUS | Status: AC
  Filled 2015-06-09: qty 100

## 2015-06-09 MED ORDER — SCOPOLAMINE 1 MG/3DAYS TD PT72: 1.0000 | MEDICATED_PATCH | Freq: Once | TRANSDERMAL | Status: DC | PRN

## 2015-06-09 MED ORDER — ONDANSETRON HCL 4 MG/2ML IJ SOLN
INTRAMUSCULAR | Status: DC | PRN
  Administered 2015-06-09: 4 mg via INTRAVENOUS

## 2015-06-09 MED ORDER — CEFAZOLIN SODIUM-DEXTROSE 2-3 GM-% IV SOLR
INTRAVENOUS | Status: AC
  Filled 2015-06-09: qty 50

## 2015-06-09 MED ORDER — LIDOCAINE HCL (CARDIAC) 20 MG/ML IV SOLN
INTRAVENOUS | Status: AC
  Filled 2015-06-09: qty 5

## 2015-06-09 MED ORDER — ONDANSETRON HCL 4 MG/2ML IJ SOLN: 4.0000 mg | Freq: Once | INTRAMUSCULAR | Status: DC | PRN

## 2015-06-09 MED ORDER — ONDANSETRON HCL 4 MG/2ML IJ SOLN
INTRAMUSCULAR | Status: AC
  Filled 2015-06-09: qty 2

## 2015-06-09 MED ORDER — FENTANYL CITRATE (PF) 100 MCG/2ML IJ SOLN
50.0000 ug | INTRAMUSCULAR | Status: DC | PRN
  Administered 2015-06-09: 50 ug via INTRAVENOUS

## 2015-06-09 MED ORDER — DEXAMETHASONE SODIUM PHOSPHATE 10 MG/ML IJ SOLN
INTRAMUSCULAR | Status: AC
  Filled 2015-06-09: qty 1

## 2015-06-09 MED ORDER — FENTANYL CITRATE (PF) 100 MCG/2ML IJ SOLN: 25.0000 ug | INTRAMUSCULAR | Status: DC | PRN

## 2015-06-09 MED ORDER — PHENYLEPHRINE HCL 10 MG/ML IJ SOLN
INTRAMUSCULAR | Status: DC | PRN
  Administered 2015-06-09 (×2): 40 ug via INTRAVENOUS

## 2015-06-09 MED ORDER — LIDOCAINE HCL (CARDIAC) 20 MG/ML IV SOLN
INTRAVENOUS | Status: DC | PRN
  Administered 2015-06-09: 50 mg via INTRAVENOUS

## 2015-06-09 MED ORDER — LACTATED RINGERS IV SOLN
INTRAVENOUS | Status: DC
  Administered 2015-06-09 (×2): via INTRAVENOUS

## 2015-06-09 SURGICAL SUPPLY — 58 items
APPLIER CLIP 9.375 MED OPEN (MISCELLANEOUS) ×3
BINDER BREAST LRG (GAUZE/BANDAGES/DRESSINGS) ×3 IMPLANT
BINDER BREAST MEDIUM (GAUZE/BANDAGES/DRESSINGS) IMPLANT
BINDER BREAST XLRG (GAUZE/BANDAGES/DRESSINGS) IMPLANT
BINDER BREAST XXLRG (GAUZE/BANDAGES/DRESSINGS) IMPLANT
BLADE SURG 15 STRL LF DISP TIS (BLADE) ×1 IMPLANT
BLADE SURG 15 STRL SS (BLADE) ×2
CANISTER SUC SOCK COL 7IN (MISCELLANEOUS) IMPLANT
CANISTER SUCT 1200ML W/VALVE (MISCELLANEOUS) IMPLANT
CHLORAPREP W/TINT 26ML (MISCELLANEOUS) ×3 IMPLANT
CLIP APPLIE 9.375 MED OPEN (MISCELLANEOUS) ×1 IMPLANT
CLOSURE WOUND 1/2 X4 (GAUZE/BANDAGES/DRESSINGS) ×1
COVER BACK TABLE 60X90IN (DRAPES) ×3 IMPLANT
COVER MAYO STAND STRL (DRAPES) ×3 IMPLANT
COVER PROBE W GEL 5X96 (DRAPES) ×3 IMPLANT
DECANTER SPIKE VIAL GLASS SM (MISCELLANEOUS) IMPLANT
DEVICE DUBIN W/COMP PLATE 8390 (MISCELLANEOUS) ×3 IMPLANT
DRAPE LAPAROSCOPIC ABDOMINAL (DRAPES) ×3 IMPLANT
DRSG TEGADERM 4X4.75 (GAUZE/BANDAGES/DRESSINGS) IMPLANT
ELECT COATED BLADE 2.86 ST (ELECTRODE) ×3 IMPLANT
ELECT REM PT RETURN 9FT ADLT (ELECTROSURGICAL) ×3
ELECTRODE REM PT RTRN 9FT ADLT (ELECTROSURGICAL) ×1 IMPLANT
GLOVE BIO SURGEON STRL SZ7 (GLOVE) ×6 IMPLANT
GLOVE BIOGEL PI IND STRL 7.0 (GLOVE) ×1 IMPLANT
GLOVE BIOGEL PI IND STRL 7.5 (GLOVE) ×1 IMPLANT
GLOVE BIOGEL PI INDICATOR 7.0 (GLOVE) ×2
GLOVE BIOGEL PI INDICATOR 7.5 (GLOVE) ×2
GLOVE ECLIPSE 6.5 STRL STRAW (GLOVE) ×3 IMPLANT
GOWN STRL REUS W/ TWL LRG LVL3 (GOWN DISPOSABLE) ×2 IMPLANT
GOWN STRL REUS W/TWL LRG LVL3 (GOWN DISPOSABLE) ×4
ILLUMINATOR WAVEGUIDE N/F (MISCELLANEOUS) IMPLANT
KIT MARKER MARGIN INK (KITS) ×3 IMPLANT
LIGHT WAVEGUIDE WIDE FLAT (MISCELLANEOUS) IMPLANT
LIQUID BAND (GAUZE/BANDAGES/DRESSINGS) ×3 IMPLANT
MARKER SKIN DUAL TIP RULER LAB (MISCELLANEOUS) ×3 IMPLANT
NEEDLE HYPO 25X1 1.5 SAFETY (NEEDLE) ×3 IMPLANT
NS IRRIG 1000ML POUR BTL (IV SOLUTION) IMPLANT
PACK BASIN DAY SURGERY FS (CUSTOM PROCEDURE TRAY) ×3 IMPLANT
PENCIL BUTTON HOLSTER BLD 10FT (ELECTRODE) ×3 IMPLANT
SLEEVE SCD COMPRESS KNEE MED (MISCELLANEOUS) ×3 IMPLANT
SPONGE GAUZE 4X4 12PLY STER LF (GAUZE/BANDAGES/DRESSINGS) IMPLANT
SPONGE LAP 4X18 X RAY DECT (DISPOSABLE) ×3 IMPLANT
STAPLER VISISTAT 35W (STAPLE) IMPLANT
STRIP CLOSURE SKIN 1/2X4 (GAUZE/BANDAGES/DRESSINGS) ×2 IMPLANT
SUT MNCRL AB 4-0 PS2 18 (SUTURE) ×3 IMPLANT
SUT MON AB 5-0 PS2 18 (SUTURE) IMPLANT
SUT SILK 2 0 SH (SUTURE) IMPLANT
SUT VIC AB 2-0 SH 27 (SUTURE) ×2
SUT VIC AB 2-0 SH 27XBRD (SUTURE) ×1 IMPLANT
SUT VIC AB 3-0 SH 27 (SUTURE) ×2
SUT VIC AB 3-0 SH 27X BRD (SUTURE) ×1 IMPLANT
SUT VIC AB 5-0 PS2 18 (SUTURE) IMPLANT
SYR CONTROL 10ML LL (SYRINGE) ×3 IMPLANT
TOWEL OR 17X24 6PK STRL BLUE (TOWEL DISPOSABLE) ×3 IMPLANT
TOWEL OR NON WOVEN STRL DISP B (DISPOSABLE) ×3 IMPLANT
TUBE CONNECTING 20'X1/4 (TUBING)
TUBE CONNECTING 20X1/4 (TUBING) IMPLANT
YANKAUER SUCT BULB TIP NO VENT (SUCTIONS) IMPLANT

## 2015-06-09 NOTE — Anesthesia Preprocedure Evaluation (Signed)
Anesthesia Evaluation  Patient identified by MRN, date of birth, ID band Patient awake    Reviewed: Allergy & Precautions, H&P , NPO status , Patient's Chart, lab work & pertinent test results  History of Anesthesia Complications (+) PONV and history of anesthetic complications  Airway Mallampati: II  TM Distance: >3 FB Neck ROM: full    Dental no notable dental hx.    Pulmonary neg pulmonary ROS,    Pulmonary exam normal breath sounds clear to auscultation       Cardiovascular hypertension, Pt. on medications Normal cardiovascular exam Rhythm:regular Rate:Normal     Neuro/Psych Anxiety negative neurological ROS     GI/Hepatic Neg liver ROS, GERD  ,  Endo/Other  negative endocrine ROS  Renal/GU negative Renal ROS     Musculoskeletal   Abdominal   Peds  Hematology negative hematology ROS (+)   Anesthesia Other Findings   Reproductive/Obstetrics negative OB ROS (+) Breast feeding                              Anesthesia Physical Anesthesia Plan  ASA: II  Anesthesia Plan: General   Post-op Pain Management:    Induction: Intravenous  Airway Management Planned: LMA  Additional Equipment:   Intra-op Plan:   Post-operative Plan: Extubation in OR  Informed Consent: I have reviewed the patients History and Physical, chart, labs and discussed the procedure including the risks, benefits and alternatives for the proposed anesthesia with the patient or authorized representative who has indicated his/her understanding and acceptance.   Dental Advisory Given  Plan Discussed with: Anesthesiologist, CRNA and Surgeon  Anesthesia Plan Comments: (Background propofol infusion, decadron, zofran, IV acetaminophen to help with PONV risk)        Anesthesia Quick Evaluation

## 2015-06-09 NOTE — Anesthesia Postprocedure Evaluation (Signed)
  Anesthesia Post-op Note  Patient: Tammy Oneill  Procedure(s) Performed: Procedure(s) (LRB): BREAST LUMPECTOMY WITH RADIOACTIVE SEED LOCALIZATION (Left)  Patient Location: PACU  Anesthesia Type: General  Level of Consciousness: awake and alert   Airway and Oxygen Therapy: Patient Spontanous Breathing  Post-op Pain: mild  Post-op Assessment: Post-op Vital signs reviewed, Patient's Cardiovascular Status Stable, Respiratory Function Stable, Patent Airway and No signs of Nausea or vomiting  Last Vitals:  Filed Vitals:   06/09/15 0952  BP:   Pulse: 63  Temp:   Resp: 12    Post-op Vital Signs: stable   Complications: No apparent anesthesia complications

## 2015-06-09 NOTE — H&P (Signed)
7 yof I know from prior double lumpectomy/sn in 2011 for idc, er/pr pos, her2 negative. she did undergo radiotherapy. she declined antiestrogen therapy. she has done well since then. she has no symptoms related to either breast no mass or no discharge. she has no rue symptoms. she underwent screening mm that showed right breast calcs that on spot mag views were stable. she has 8 mm mass in left breast, axillary [place] per report is negative. her biopsy result shows low grade idc (communication with pathologist), er/pr pos at 100%, her 2 negative and Ki is 5%.    Other Problems Elbert Ewings, CMA; 06/07/2015 2:25 PM) Breast Cancer Cervical Cancer Gastroesophageal Reflux Disease High blood pressure Melanoma  Past Surgical History Elbert Ewings, CMA; 06/07/2015 2:25 PM) Appendectomy Breast Biopsy Left. Cataract Surgery Left. Hysterectomy (due to cancer) - Complete Hysterectomy (due to cancer) - Partial Oral Surgery  Diagnostic Studies History Elbert Ewings, CMA; 06/07/2015 2:25 PM) Colonoscopy 1-5 years ago Mammogram within last year Pap Smear >5 years ago  Allergies Elbert Ewings, CMA; 06/07/2015 2:26 PM) Morphine Sulfate (Concentrate) *ANALGESICS - OPIOID*  Medication History Elbert Ewings, CMA; 06/07/2015 2:27 PM) Metoprolol Succinate ER (25MG Tablet ER 24HR, Oral) Active. Telmisartan-HCTZ (80-12.5MG Tablet, Oral) Active. Aspirin (81MG Tablet, Oral) Active. Medications Reconciled  Social History Elbert Ewings, [place]; 06/07/2015 2:25 PM) Caffeine use Carbonated beverages, Coffee, Tea. No alcohol use No drug use Tobacco use Never smoker.  Family History Elbert Ewings, [place]; 06/07/2015 2:25 PM) Alcohol Abuse Brother, Father, Mother. Cancer Father. Depression Mother. Hypertension Mother. Melanoma Father.  Pregnancy / Birth History Elbert Ewings, CMA; 06/07/2015 2:25 PM) Age at menarche 16 years. Gravida 5 Maternal age <15 Para 5  Review of Systems  Elbert Ewings CMA; 06/07/2015 2:25 PM) General Present- Fatigue and Weight Loss. Not Present- Appetite Loss, Chills, Fever, Night Sweats and Weight Gain. Skin Present- Rash. Not Present- Change in Wart/Mole, Dryness, Hives, Jaundice, New Lesions, Non-Healing Wounds and Ulcer. HEENT Present- Ringing in the Ears, Visual Disturbances and Wears glasses/contact lenses. Not Present- Earache, Hearing Loss, Hoarseness, Nose Bleed, Oral Ulcers, Seasonal Allergies, Sinus Pain, Sore Throat and Yellow Eyes. Respiratory Not Present- Bloody sputum, Chronic Cough, Difficulty Breathing, Snoring and Wheezing. Breast Present- Breast Pain. Not Present- Breast Mass, Nipple Discharge and Skin Changes. Cardiovascular Present- Leg Cramps and Swelling of Extremities. Not Present- Chest Pain, Difficulty Breathing Lying Down, Palpitations, Rapid Heart Rate and Shortness of Breath. Gastrointestinal Present- Difficulty Swallowing and Indigestion. Not Present- Abdominal Pain, Bloating, Bloody Stool, Change in Bowel Habits, Chronic diarrhea, Constipation, Excessive gas, Gets full quickly at meals, Hemorrhoids, Nausea, Rectal Pain and Vomiting. Female Genitourinary Present- Frequency. Not Present- Nocturia, Painful Urination, Pelvic Pain and Urgency. Musculoskeletal Present- Joint Pain, Joint Stiffness, Muscle Pain and Muscle Weakness. Not Present- Back Pain and Swelling of Extremities. Neurological Present- Headaches, Trouble walking and Weakness. Not Present- Decreased Memory, Fainting, Numbness, Seizures, Tingling and Tremor. Psychiatric Not Present- Anxiety, Bipolar, Change in Sleep Pattern, Depression, Fearful and Frequent crying. Endocrine Not Present- Cold Intolerance, Excessive Hunger, Hair Changes, Heat Intolerance, Hot flashes and New Diabetes. Hematology Not Present- Easy Bruising, Excessive bleeding, Gland problems, HIV and Persistent Infections.   Vitals Elbert Ewings CMA; 06/07/2015 2:27 PM) 06/07/2015 2:27 PM Weight:  139 lb Height: 62in Body Surface Area: 1.66 m Body Mass Index: 25.42 kg/m Temp.: 97.59F(Temporal)  Pulse: 63 (Regular)  BP: 128/62 (Sitting, Left Arm, Standard)    Physical Exam Rolm Bookbinder MD; 06/07/2015 2:51 PM) General Mental Status-Alert. Orientation-Oriented X3.  Chest and Lung  Exam Chest and lung exam reveals -on auscultation, normal breath sounds, no adventitious sounds and normal vocal resonance.  Breast Nipples-No Discharge. Breast Lump-No Palpable Breast Mass.   Cardiovascular Cardiovascular examination reveals -normal heart sounds, regular rate and rhythm with no murmurs.  Lymphatic Head & Neck  General Head & Neck Lymphatics: Bilateral - Description - Normal. Axillary  General Axillary Region: Bilateral - Description - Normal. Note: no [place] adenopathy     Assessment & Plan Rolm Bookbinder MD; 06/07/2015 3:05 PM) CANCER OF UPPER-OUTER QUADRANT OF FEMALE BREAST (C50.419) Story: Left breast radioactive seed guided lumpectomy We discussed the options for treatment of the breast cancer which included lumpectomy versus a mastectomy. We discussed the performance of the lumpectomy with radioactive seed placement. We discussed a 5% chance of a positive margin requiring reexcision in the operating room. We also discussed consideration of radiotherapy or antiestrogen therapy postoperatively. We discussed the mastectomy (removal of whole breast) and the postoperative care for that as well. Mastectomy can be followed by reconstruction. This is a more extensive surgery and requires more recovery. The decision for lumpectomy vs mastectomy has no impact on decision for chemotherapy. Most mastectomy patients will not need radiation therapy. We discussed that there is no difference in her survival whether she undergoes lumpectomy with some adjuvant therapy or antiestrogen therapy versus a mastectomy. There is also no real difference between her  recurrence in the breast. I think reasonable in this 88 yof with this tumor to omit sn biopsy as discussed. also will refer to genetics. she has brca testing before only and daughters I dont think had expanded panel testing. We discussed the risks of operation including bleeding, infection, possible reoperation. She understands her further therapy will be based on what her stages at the time of her operation.

## 2015-06-09 NOTE — Transfer of Care (Signed)
Immediate Anesthesia Transfer of Care Note  Patient: Tammy Oneill  Procedure(s) Performed: Procedure(s): BREAST LUMPECTOMY WITH RADIOACTIVE SEED LOCALIZATION (Left)  Patient Location: PACU  Anesthesia Type:General  Level of Consciousness: awake  Airway & Oxygen Therapy: Patient Spontanous Breathing and Patient connected to face mask oxygen  Post-op Assessment: Report given to RN and Post -op Vital signs reviewed and stable  Post vital signs: Reviewed and stable  Last Vitals:  Filed Vitals:   06/09/15 0757  BP: 143/50  Pulse: 59  Temp: 36.5 C  Resp: 20    Complications: No apparent anesthesia complications

## 2015-06-09 NOTE — Anesthesia Procedure Notes (Signed)
Procedure Name: LMA Insertion Date/Time: 06/09/2015 9:04 AM Performed by: Lieutenant Diego Pre-anesthesia Checklist: Patient identified, Emergency Drugs available, Suction available and Patient being monitored Patient Re-evaluated:Patient Re-evaluated prior to inductionOxygen Delivery Method: Circle System Utilized Preoxygenation: Pre-oxygenation with 100% oxygen Intubation Type: IV induction Ventilation: Mask ventilation without difficulty LMA: LMA inserted LMA Size: 3.0 Number of attempts: 1 Airway Equipment and Method: Bite block Placement Confirmation: positive ETCO2 and breath sounds checked- equal and bilateral Tube secured with: Tape Dental Injury: Teeth and Oropharynx as per pre-operative assessment

## 2015-06-09 NOTE — Discharge Instructions (Signed)
[facility] °Office Phone Number [phone] ° °POST OP INSTRUCTIONS ° °Always review your discharge instruction sheet given to you by the facility where your surgery was performed. ° °IF YOU HAVE DISABILITY OR FAMILY LEAVE FORMS, YOU MUST BRING THEM TO THE OFFICE FOR PROCESSING.  DO NOT GIVE THEM TO YOUR DOCTOR. ° °1. A prescription for pain medication may be given to you upon discharge.  Take your pain medication as prescribed, if needed.  If narcotic pain medicine is not needed, then you may take acetaminophen (Tylenol), naprosyn (Alleve) or ibuprofen (Advil) as needed. °2. Take your usually prescribed medications unless otherwise directed °3. If you need a refill on your pain medication, please contact your pharmacy.  They will contact our office to request authorization.  Prescriptions will not be filled after 5pm or on week-ends. °4. You should eat very light the first 24 hours after surgery, such as soup, crackers, pudding, etc.  Resume your normal diet the day after surgery. °5. Most patients will experience some swelling and bruising in the breast.  Ice packs and a good support bra will help.  Wear the breast binder provided or a sports bra for 72 hours day and night.  After that wear a sports bra during the day until you return to the office. Swelling and bruising can take several days to resolve.  °6. It is common to experience some constipation if taking pain medication after surgery.  Increasing fluid intake and taking a stool softener will usually help or prevent this problem from occurring.  A mild laxative (Milk of Magnesia or Miralax) should be taken according to package directions if there are no bowel movements after 48 hours. °7. Unless discharge instructions indicate otherwise, you may remove your bandages 48 hours after surgery and you may shower at that time.  You may have steri-strips (small skin tapes) in place directly over the incision.  These strips should be left on the  skin for 7-10 days and will come off on their own.  If your surgeon used skin glue on the incision, you may shower in 24 hours.  The glue will flake off over the next 2-3 weeks.  Any sutures or staples will be removed at the office during your follow-up visit. °8. ACTIVITIES:  You may resume regular daily activities (gradually increasing) beginning the next day.  Wearing a good support bra or sports bra minimizes pain and swelling.  You may have sexual intercourse when it is comfortable. °a. You may drive when you no longer are taking prescription pain medication, you can comfortably wear a seatbelt, and you can safely maneuver your car and apply brakes. °b. RETURN TO WORK:  ______________________________________________________________________________________ °9. You should see your doctor in the office for a follow-up appointment approximately two weeks after your surgery.  Your doctor’s nurse will typically make your follow-up appointment when she calls you with your pathology report.  Expect your pathology report 3-4 business days after your surgery.  You may call to check if you do not hear from us after three days. °10. OTHER INSTRUCTIONS: _______________________________________________________________________________________________ _____________________________________________________________________________________________________________________________________ °_____________________________________________________________________________________________________________________________________ °_____________________________________________________________________________________________________________________________________ ° °WHEN TO CALL DR WAKEFIELD: °1. Fever over 101.0 °2. Nausea and/or vomiting. °3. Extreme swelling or bruising. °4. Continued bleeding from incision. °5. Increased pain, redness, or drainage from the incision. ° °The clinic staff is available to answer your questions during regular  business hours.  Please don’t hesitate to call and ask to speak to one of the nurses for clinical concerns.  If   you have a medical emergency, go to the nearest emergency room or call 911.  A surgeon from [facility] is always on call at the hospital. ° °For further questions, please visit [email or web address] ° ° ° °Post Anesthesia Home Care Instructions ° °Activity: °Get plenty of rest for the remainder of the day. A responsible adult should stay with you for 24 hours following the procedure.  °For the next 24 hours, DO NOT: °-Drive a car °-Operate machinery °-Drink alcoholic beverages °-Take any medication unless instructed by your physician °-Make any legal decisions or sign important papers. ° °Meals: °Start with liquid foods such as gelatin or soup. Progress to regular foods as tolerated. Avoid greasy, spicy, heavy foods. If nausea and/or vomiting occur, drink only clear liquids until the nausea and/or vomiting subsides. Call your physician if vomiting continues. ° °Special Instructions/Symptoms: °Your throat may feel dry or sore from the anesthesia or the breathing tube placed in your throat during surgery. If this causes discomfort, gargle with warm salt water. The discomfort should disappear within 24 hours. ° °If you had a scopolamine patch placed behind your ear for the management of post- operative nausea and/or vomiting: ° °1. The medication in the patch is effective for 72 hours, after which it should be removed.  Wrap patch in a tissue and discard in the trash. Wash hands thoroughly with soap and water. °2. You may remove the patch earlier than 72 hours if you experience unpleasant side effects which may include dry mouth, dizziness or visual disturbances. °3. Avoid touching the patch. Wash your hands with soap and water after contact with the patch. °  ° °

## 2015-06-09 NOTE — Op Note (Signed)
Preoperative diagnosis: clinical stage 1 left breast cancer Postoperative diagnosis: Same as above Procedure: left breast radioactive seed guided lumpectomy Surgeon: Dr. Serita Grammes Anesthesia: Gen.  Specimens: #1 left breast tissue containing radioactive seed  marked with paint Complications: None Drains: None Estimated blood loss: Minimal Sponge and needle count was correct at completion Disposition to recovery stable  Indications: This is a 75 yof with newly diagnosed left breast cancer. I have cared for her previously for her right breast cancer.  She was discussed at our multidisciplinary conference and we have elected to omit sentinel node. We discussed options and decided to proceed with lumpectomy. She had radioactive seed placed prior to beginning. I had these mammograms available for review. The seed migrated a little and I discussed this with Dr Isaiah Blakes prior to beginning.   Procedure: After informed consent was obtained the patient first wasplaced under general anesthesia without complication. Her breast was then prepped and draped in the standard sterile surgical fashion. A surgical timeout was then performed.  I located the radioactive seed in the upper inner portion of the breast. I used the ultrasound to locate the tumor.I made an incision overlying the seed and tumor after infiltrating with local anesthetic. I then was able to excise the radioactive seed and the surrounding tissue with an attempt to get a clear margin. I did also remove the clip as it was identifiable. Hemostasis was then observed. I did a mammogram which confirmed removal of both clip and the radioactive seed. I confirmed seed removal with the neoprobe as well. There was no background radioactivity. I then placed clips around this cavity. I closed the cavity with 2-0 Vicryl. It hen closed the skin with 3-0 vicryl and 4-0 monocryl. Glue and steristrips were applied.She tolerated this well. She was  extubated and transferred to recovery in stable condition.

## 2015-06-09 NOTE — Interval H&P Note (Signed)
History and Physical Interval Note:  06/09/2015 8:43 AM  Su Monks  has presented today for surgery, with the diagnosis of LEFT BREAST CANCER  The various methods of treatment have been discussed with the patient and family. After consideration of risks, benefits and other options for treatment, the patient has consented to  Procedure(s): BREAST LUMPECTOMY WITH RADIOACTIVE SEED LOCALIZATION (Left) as a surgical intervention .  The patient's history has been reviewed, patient examined, no change in status, stable for surgery.  I have reviewed the patient's chart and labs.  Questions were answered to the patient's satisfaction.     WAKEFIELD,MATTHEW

## 2015-06-10 ENCOUNTER — Encounter (HOSPITAL_BASED_OUTPATIENT_CLINIC_OR_DEPARTMENT_OTHER): Payer: Self-pay | Admitting: General Surgery

## 2015-06-10 NOTE — Addendum Note (Signed)
Addendum  created 06/10/15 1100 by Ernesta Amble Blocker, CRNA   Modules edited: Charges VN

## 2015-06-13 ENCOUNTER — Other Ambulatory Visit: Payer: Self-pay | Admitting: Oncology

## 2015-06-14 ENCOUNTER — Telehealth: Payer: Self-pay | Admitting: Oncology

## 2015-06-14 NOTE — Telephone Encounter (Signed)
Left message to confirm appointment for 10/03 °

## 2015-06-15 ENCOUNTER — Encounter: Payer: Self-pay | Admitting: Oncology

## 2015-06-20 ENCOUNTER — Ambulatory Visit (HOSPITAL_BASED_OUTPATIENT_CLINIC_OR_DEPARTMENT_OTHER): Payer: Medicare Other | Admitting: Nurse Practitioner

## 2015-06-20 ENCOUNTER — Other Ambulatory Visit: Payer: Self-pay | Admitting: Oncology

## 2015-06-20 ENCOUNTER — Telehealth: Payer: Self-pay | Admitting: Oncology

## 2015-06-20 VITALS — BP 139/62 | HR 56 | Temp 97.8°F | Resp 18 | Ht 62.0 in | Wt 140.5 lb

## 2015-06-20 DIAGNOSIS — Z23 Encounter for immunization: Secondary | ICD-10-CM

## 2015-06-20 DIAGNOSIS — Z170 Estrogen receptor positive status [ER+]: Secondary | ICD-10-CM | POA: Diagnosis not present

## 2015-06-20 DIAGNOSIS — C50912 Malignant neoplasm of unspecified site of left female breast: Secondary | ICD-10-CM

## 2015-06-20 DIAGNOSIS — Z853 Personal history of malignant neoplasm of breast: Secondary | ICD-10-CM | POA: Diagnosis not present

## 2015-06-20 MED ORDER — INFLUENZA VAC SPLIT QUAD 0.5 ML IM SUSY
0.5000 mL | PREFILLED_SYRINGE | Freq: Once | INTRAMUSCULAR | Status: AC
  Administered 2015-06-20: 0.5 mL via INTRAMUSCULAR
  Filled 2015-06-20: qty 0.5

## 2015-06-20 MED ORDER — TAMOXIFEN CITRATE 20 MG PO TABS: 20.0000 mg | ORAL_TABLET | Freq: Every day | ORAL | Status: DC

## 2015-06-20 NOTE — Progress Notes (Signed)
ID: Tammy Oneill   DOB: 01-20-1933  MR#: [id number]  GYF#:[id number]  CHIEF COMPLAINT: right breast cancer CURRENT TREATMENT: observation  BREAST CANCER HISTORY:  She was feeling fine, and had no particular symptoms when she had routine mammographic screening at [facility] March 02, 2010.  Her prior mammogram had been in 2008.  This time Dr. Isaiah Blakes noted an irregular area of focal asymmetry in the upper outer quadrant of the right breast, and the patient was brought back for a diagnostic mammography and right breast ultrasonography March 13, 2010.  This confirmed an irregular ill defined, mixed density/hyperdense mass in the right breast with some spiculation and distortion, which by ultrasound measured 1.2 cm.  There was also a smaller, though otherwise similar lesion more posteriorly, and that one measured 8 mm by ultrasound.  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  Both were estrogen and progesterone receptor positive and HER2-, and both had a low proliferation fraction, although morphologically there was some difference between the masses, so that at the Multidisciplinary Breast Cancer Conference this morning, it was felt most likely this were two concurrent primaries.  With this information the patient was referred to Dr. Donne Hazel, and bilateral breast MRIs were obtained January 7th.  This showed the larger mass to be 1.4 cm, the smaller one to measure 1.1 cm, but there was no other finding of significance in either breast, and there was no evidence of adenopathy.   Accordingly, the patient proceeded to definitive lumpectomies, which were performed August 17.  The final pathology from this procedure ([id number]) showed two invasive ductal carcinomas, the larger measuring 1.4 cm, the smaller 7 mm, again both ER/PR+, and HER2-.  Both were grade 1.  Margins were close, although negative, and there was  no evidence of lymphovascular invasion.  Zero of two sentinel lymph nodes were involved.  The patient is staged at T1c N0, or stage I.  Her subsequent history is as detailed below.  INTERVAL HISTORY: Tammy Oneill returns for follow up of her breast cancer. She refused anti-estrogen therapy in 2011 and has only been followed up with observation. 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   REVIEW OF SYSTEMS: [name] does not exercise regularly but does her own yard work with a Chiropractor on a Hotel manager. She denies fevers, chills, nausea, vomiting, or changes in bowel habits. She denies shortness of breath, chest pain, cough, or weakness. Her right shoulder hurts from a history of bursitis. Her right foot is sore from plantar fasciitis for which she will be making an appointment with a podiatrist. She states she does get somewhat depressed for weeks at a time, then suddenly she "snaps out of it." She desires to be "evened out." She is active in her church but she states sometimes she loses interest in activities she used to enjoy. She had trouble sleeping some nights. She denies anxiety or suicidal thoughts. A detailed review of systems is otherwise noncontributory.   PAST MEDICAL HISTORY: Past Medical History  Diagnosis Date  . Hypertension   . Hyperlipidemia   . Anxiety   . GERD (gastroesophageal reflux disease)   . PONV (postoperative nausea and vomiting)   . Osteopenia     on actonel  The past medical history is significant for osteopenia, history of cervical cancer status post simple hysterectomy without salpingo oophorectomy at  the age of 69, history of osteoarthritis especially involving the right shoulder where she says she has a fairly chronic bursitis, hypercholesterolemia, history of frequent urinary tract infections, history of appendectomy, history of cataracts, not yet surgically removed (she tells me Dr. Katy Fitch is planning to do this in  January), and history of "an enlarged heart" although this was not seen in the most recent chest x-ray.    PAST SURGICAL HISTORY: Past Surgical History  Procedure Laterality Date  . Abdominal hysterectomy    . Breast surgery Right 2011    lumpectomy Dr Donne Hazel  . Breast lumpectomy with radioactive seed localization Left 06/09/2015    Procedure: BREAST LUMPECTOMY WITH RADIOACTIVE SEED LOCALIZATION;  Surgeon: Rolm Bookbinder, MD;  Location: [facility];  Service: General;  Laterality: Left;    FAMILY HISTORY The patient's mother died with bladder cancer at the age of 15.  The patient's father died with lung cancer at the age of 7.  She had one brother who died in an automobile accident. One sister is "okay".  There is no breast or ovarian cancer in the family except for the patient's older daughter, Tammy Oneill, who lives in [place], and was diagnosed with breast cancer at the age of 21. She was [facility] at [facility].   GYNECOLOGIC HISTORY: GX P5.  First pregnancy to term at age 89.  Hysterectomy at age 55.  She took Premarin for many years, stopping about ten years ago.   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  She has been divorced twice, lives home alone, has no pets.  Son, [name], works in a Psychologist, prison and probation services company here in [place], as does son, [name].  Daughter, Tammy Oneill in [place] had breast cancer. Daughter,Tammy Oneill is also my patient and works at [employer] here in [place]. Daughter, Tammy Oneill, works in Engineer, technical sales in [place], [place].  The patient has nine grandchildren and three great-grandchildren.  She attends [employer].     ADVANCED DIRECTIVES:  HEALTH MAINTENANCE: Social History  Substance Use Topics  . Smoking status: Never Smoker   . Smokeless tobacco: Never Used  . Alcohol Use: No      Colonoscopy:  PAP:  Bone density:  Lipid panel:  Allergies  Allergen Reactions  . Morphine And Related     vomiting    Current Outpatient Prescriptions  Medication Sig Dispense Refill  . aspirin 81 MG tablet Take 81 mg by mouth daily.     Marland Kitchen atorvastatin (LIPITOR) 10 MG tablet Take 10 mg by mouth daily.  6  . CALCIUM-MAGNESIUM-ZINC PO Take 1 tablet by mouth 2 (two) times daily.    . metoprolol succinate (TOPROL-XL) 25 MG 24 hr tablet Take 1/2 tablet daily 45 tablet 3  . telmisartan-hydrochlorothiazide (MICARDIS HCT) 80-12.5 MG per tablet TAKE 1/2 TABLET BY MOUTH EVERY DAY    . ACTONEL 35 MG tablet TAKE 1 TABLET BY MOUTH WEEKLY (Patient not taking: Reported on 06/20/2015) 12 tablet 1  . tamoxifen (NOLVADEX) 20 MG tablet Take 1 tablet (20 mg total) by mouth daily. 90 tablet 3   No current facility-administered medications for this visit.    OBJECTIVE: Elderly white woman in no acute distress Filed Vitals:   06/20/15 1351  BP: 139/62  Pulse: 56  Temp: 97.8 F (36.6 C)  Resp: 18     Body mass index is  25.69 kg/(m^2).    ECOG FS: 0  Skin: warm, dry  HEENT: sclerae anicteric, conjunctivae pink, oropharynx clear. No thrush or mucositis.  Lymph Nodes: No cervical or supraclavicular lymphadenopathy  Lungs: clear to auscultation bilaterally, no rales, wheezes, or rhonci  Heart: regular rate and rhythm  Abdomen: round, soft, non tender, positive bowel sounds  Musculoskeletal: No focal spinal tenderness, no peripheral edema  Neuro: non focal, well oriented, positive affect  Breasts: right breast status post lumpectomy and radiation. No evidence of recurrent disease. Right axilla benign. Left breast status post recent lumpectomy. Healing well. Incision well approximated  LAB RESULTS: Lab Results  Component Value Date   WBC 5.4 05/11/2015   NEUTROABS 2.7 05/11/2015   HGB 12.7 05/11/2015   HCT 36.9 05/11/2015   MCV 96.4 05/11/2015   PLT 180 05/11/2015      Chemistry       Component Value Date/Time   NA 140 05/11/2015 1323   NA 137 03/18/2012 1433   K 5.1 05/11/2015 1323   K 4.5 03/18/2012 1433   CL 101 03/18/2012 1433   CO2 28 05/11/2015 1323   CO2 30 03/18/2012 1433   BUN 18.5 05/11/2015 1323   BUN 17 03/18/2012 1433   CREATININE 1.1 05/11/2015 1323   CREATININE 1.23* 03/18/2012 1433      Component Value Date/Time   CALCIUM 10.0 05/11/2015 1323   CALCIUM 9.7 03/18/2012 1433   ALKPHOS 121 05/11/2015 1323   ALKPHOS 68 03/18/2012 1433   AST 17 05/11/2015 1323   AST 14 03/18/2012 1433   ALT 17 05/11/2015 1323   ALT 10 03/18/2012 1433   BILITOT 0.60 05/11/2015 1323   BILITOT 0.7 03/18/2012 1433       Lab Results  Component Value Date   LABCA2 21 03/18/2012    No components found for: [id number]  No results for input(s): INR in the last 168 hours.  Urinalysis No results found for: COLORURINE  STUDIES: No results found.  ASSESSMENT: 81 y.o. BRCA negative [place] woman  (1) status post double right-sided lumpectomies August 2011 for a T1c and T1b N0, stage IA invasive ductal carcinomas, both strongly estrogen and progesterone receptor positive, HER2/neu negative, with low proliferation fractions.   (2) completed radiation therapy October 2011  (3) refused adjuvant antiestrogens.   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 She understands that the pathology of her most recent breast cancer returned estrogen/progesterone positive and will need antiestrogen therapy for treatment.   Genelle Gather Romell Wolden    06/20/2015

## 2015-06-20 NOTE — Telephone Encounter (Signed)
Gave patient avs report and appointments for January and September 2017. Patient also given appointment with Dr. Sondra Come for 11/2.

## 2015-06-21 ENCOUNTER — Encounter: Payer: Self-pay | Admitting: Nurse Practitioner

## 2015-07-04 ENCOUNTER — Telehealth: Payer: Self-pay | Admitting: *Deleted

## 2015-07-04 NOTE — Telephone Encounter (Signed)
Left vm for pt to return call regarding needs and navigation resources. Contact information provided.

## 2015-07-05 ENCOUNTER — Encounter: Payer: Self-pay | Admitting: Cardiology

## 2015-07-06 ENCOUNTER — Telehealth: Payer: Self-pay | Admitting: *Deleted

## 2015-07-06 NOTE — Telephone Encounter (Signed)
Spoke to pt to offer navigation resources.  Pt relate she was doing well after surgery and without complaints. Encourage pt to call with questions or needs. Received verbal understanding. Confirmed future appts.

## 2015-07-15 NOTE — Progress Notes (Signed)
Location of Breast Cancer: left breast cancer  Histology per Pathology Report:   06/09/15 Diagnosis Breast, lumpectomy, Left - LOW GRADE DUCTAL CARCINOMA IN SITU INVOLVING BOTH THE LUMPECTOMY SPECIMEN AND THE SEPARATE UNORIENTED BREAST TISSUE EXCISION. - ACELLULAR MUCIN IS ASSOCIATED WITH THE DUCTAL CARCINOMA IN SITU. - THE CAUTERIZED MARGIN OF THE UNORIENTED TISSUE EXCISION IS POSITIVE FOR LOW GRADE DUCTAL CARCINOMA IN SITU. - LOW GRADE DUCTAL DUCTAL CARCINOMA IN SITU IS FOCALLY 0.1 TO 0.2 CM AWAY FROM THE LATERAL MARGIN ON THE ORIENTED LEFT BREAST LUMPECTOMY SPECIMEN. - OTHER MARGINS ARE NEGATIVE. - SEE ONCOLOGY TEMPLATE AND COMMENT.  06/02/15 Diagnosis Breast, left, needle core biopsy - DUCTAL CARCINOMA WITH EXTRACELLULAR MUCIN. - SEE MICROSCOPIC DESCRIPTION  Receptor Status: ER(100%), PR (100%), Her2-neu (negative)  Did patient present with symptoms (if so, please note symptoms) or was this found on screening mammography?: mammography  GYNECOLOGIC HISTORY: GX P5. First pregnancy to term at age 48. Hysterectomy at age 26. She took Premarin for many years, stopping about ten years ago.   Past/Anticipated interventions by surgeon, if any: 06/09/15 - Procedure: BREAST LUMPECTOMY WITH RADIOACTIVE SEED LOCALIZATION;  Surgeon: Rolm Bookbinder, MD;  Location: [facility];  Service: General;  Laterality: Left;  Past/Anticipated interventions by medical oncology, if any: Plans for antiestrogen therapy - refused for her past right breast cancer  Lymphedema issues, if any: reports swelling at night in her lower legs.  Pain issues, if any:  no   SAFETY ISSUES:  Prior radiation? 06/13/2010-07/13/10 right breast 4500 cGy, boosted to 5000 cGy  Pacemaker/ICD? no  Possible current pregnancy?no  Is the patient on methotrexate? no  Current Complaints / other details:  Will start taking tamoxifen after radiation.  BP 137/54 mmHg  Pulse 56  Temp(Src) 97.6 F (36.4 C)  (Oral)  Resp 16  Ht '5\' 2"'  (1.575 m)  Wt 139 lb 9.6 oz (63.322 kg)  BMI 25.53 kg/m2

## 2015-07-20 ENCOUNTER — Encounter: Payer: Self-pay | Admitting: Radiation Oncology

## 2015-07-20 ENCOUNTER — Ambulatory Visit
Admission: RE | Admit: 2015-07-20 | Discharge: 2015-07-20 | Disposition: A | Discharge status: Home / Self Care | Payer: Medicare Other | Source: Ambulatory Visit | Attending: Radiation Oncology | Admitting: Radiation Oncology

## 2015-07-20 VITALS — BP 137/54 | HR 56 | Temp 97.6°F | Resp 16 | Ht 62.0 in | Wt 139.6 lb

## 2015-07-20 DIAGNOSIS — F419 Anxiety disorder, unspecified: Secondary | ICD-10-CM | POA: Diagnosis not present

## 2015-07-20 DIAGNOSIS — I10 Essential (primary) hypertension: Secondary | ICD-10-CM | POA: Insufficient documentation

## 2015-07-20 DIAGNOSIS — Z170 Estrogen receptor positive status [ER+]: Secondary | ICD-10-CM | POA: Diagnosis not present

## 2015-07-20 DIAGNOSIS — E785 Hyperlipidemia, unspecified: Secondary | ICD-10-CM | POA: Diagnosis not present

## 2015-07-20 DIAGNOSIS — Z923 Personal history of irradiation: Secondary | ICD-10-CM | POA: Insufficient documentation

## 2015-07-20 DIAGNOSIS — Z8052 Family history of malignant neoplasm of bladder: Secondary | ICD-10-CM | POA: Insufficient documentation

## 2015-07-20 DIAGNOSIS — Z801 Family history of malignant neoplasm of trachea, bronchus and lung: Secondary | ICD-10-CM | POA: Insufficient documentation

## 2015-07-20 DIAGNOSIS — K219 Gastro-esophageal reflux disease without esophagitis: Secondary | ICD-10-CM | POA: Insufficient documentation

## 2015-07-20 DIAGNOSIS — Z510 Encounter for antineoplastic radiation therapy: Secondary | ICD-10-CM | POA: Insufficient documentation

## 2015-07-20 DIAGNOSIS — M8580 Other specified disorders of bone density and structure, unspecified site: Secondary | ICD-10-CM | POA: Diagnosis not present

## 2015-07-20 DIAGNOSIS — C50212 Malignant neoplasm of upper-inner quadrant of left female breast: Secondary | ICD-10-CM | POA: Insufficient documentation

## 2015-07-20 HISTORY — DX: Malignant neoplasm of unspecified site of left female breast: C50.912

## 2015-07-20 HISTORY — DX: Malignant neoplasm of unspecified site of right female breast: C50.911

## 2015-07-20 NOTE — Progress Notes (Signed)
Radiation Oncology         [phone] ________________________________  Initial Outpatient Consultation  Name: Tammy Oneill MRN: [id number]  Date: 07/20/2015  DOB: 1933/04/13  TG:GYIRSWNIOEVO,JJKKX, MD  Boelter, Tammy Gather, NP   REFERRING PHYSICIAN: Laurie Panda, NP  DIAGNOSIS: The encounter diagnosis was Breast cancer of upper-inner quadrant of left female breast ([facility]). 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  HISTORY OF PRESENT ILLNESS::Tammy Oneill is a 81 y.o. female who is here as courtesy of Dr. Jana Hakim. The patient underwent regular screening mammogram on 05/24/15 which found a nodule in the left breast. She was not experiencing any breast pain at that time. Biopsy of the left breast performed on 06/02/15 revealed ductal carcinoma in situ with extracellular mucin, ER +, PR +, HER-2 (-). She underwent a lumpectomy of the left breast with Dr. Donne Hazel on 06/09/15. The patient has a history of invasive breast cancer in the right breast.  She has not seen Dr. Jana Hakim since her surgery. She is no longer experiencing soreness after surgery.  She reports her only health problem since her last breast cancer diagnosis was gum surgery. Reports intermittent throbbing in her right arm, stating that it resolves when she lays a certain way to sleep.  PREVIOUS RADIATION THERAPY: Yes, 06/13/2010-07/13/10 right breast 4500 cGy, boosted to 5000 cGy with Dr. Sondra Come  PAST MEDICAL HISTORY:  has a past medical history of Hypertension; Hyperlipidemia; Anxiety; GERD (gastroesophageal reflux disease); PONV (postoperative nausea and vomiting); Osteopenia; and Bilateral breast cancer ([facility]).    PAST SURGICAL HISTORY: Past Surgical History  Procedure Laterality Date  . Abdominal hysterectomy    . Breast surgery Right 2011    lumpectomy Dr Donne Hazel  . Breast lumpectomy with radioactive seed localization Left 06/09/2015    Procedure: BREAST LUMPECTOMY WITH RADIOACTIVE SEED LOCALIZATION;   Surgeon: Rolm Bookbinder, MD;  Location: [facility];  Service: General;  Laterality: Left;  . Appendectomy      FAMILY HISTORY: family history includes Bladder Cancer in her mother; Lung cancer in her father.  SOCIAL HISTORY:  reports that she has never smoked. She has never used smokeless tobacco. She reports that she does not drink alcohol or use illicit drugs.  ALLERGIES: Morphine and related  MEDICATIONS:  Current Outpatient Prescriptions  Medication Sig Dispense Refill  . aspirin 81 MG tablet Take 81 mg by mouth daily.     Marland Kitchen atorvastatin (LIPITOR) 10 MG tablet Take 10 mg by mouth daily.  6  . CALCIUM-MAGNESIUM-ZINC PO Take 1 tablet by mouth 2 (two) times daily.    . metoprolol succinate (TOPROL-XL) 25 MG 24 hr tablet Take 1/2 tablet daily 45 tablet 3  . telmisartan-hydrochlorothiazide (MICARDIS HCT) 80-12.5 MG per tablet TAKE 1/2 TABLET BY MOUTH EVERY DAY    . ACTONEL 35 MG tablet TAKE 1 TABLET BY MOUTH WEEKLY (Patient not taking: Reported on 06/20/2015) 12 tablet 1  . tamoxifen (NOLVADEX) 20 MG tablet Take 1 tablet (20 mg total) by mouth daily. (Patient not taking: Reported on 07/20/2015) 90 tablet 3   No current facility-administered medications for this encounter.    REVIEW OF SYSTEMS:  A 15 point review of systems is documented in the electronic medical record. This was obtained by the nursing staff. However, I reviewed this with the patient to discuss relevant findings and make appropriate changes.  No pain in either breast this time nipple discharge or bleeding. She denies any new bony pain headaches dizziness or blurred  vision   PHYSICAL EXAM:  height is '5\' 2"'  (1.575 m) and weight is 139 lb 9.6 oz (63.322 kg). Her oral temperature is 97.6 F (36.4 C). Her blood pressure is 137/54 and her pulse is 56. Her respiration is 16.    Lungs are clear to auscultation bilaterally. Heart has regular rate and rhythm. No palpable cervical, supraclavicular, or axillary  adenopathy. Bowel sounds normal. Examination of the right breast reveals well healed scar in the upper inner quadrant and a second healed scar in the lateral aspect of the right breast. She has a third scar in the axillary region from prior sentinel node biopsy. Tattoos are noted to be in place from her previous radiation therapy. Examination of the left breast shows a well healed scar in the upper inner quadrant. No palpable mass or nipple discharge.   ECOG = 0  LABORATORY DATA:  Lab Results  Component Value Date   WBC 5.4 05/11/2015   HGB 12.7 05/11/2015   HCT 36.9 05/11/2015   MCV 96.4 05/11/2015   PLT 180 05/11/2015   NEUTROABS 2.7 05/11/2015   Lab Results  Component Value Date   NA 140 05/11/2015   K 5.1 05/11/2015   CL 101 03/18/2012   CO2 28 05/11/2015   GLUCOSE 93 05/11/2015   CREATININE 1.1 05/11/2015   CALCIUM 10.0 05/11/2015      RADIOGRAPHY: No results found.    IMPRESSION: Low grade intraductal carcinoma of the left breast, ER +, PR + in the setting of two prior synchronous right breast cancers. The patient's pathology report shows a positive margin however from Dr. Cristal Generous office note this appears to be an anterior margin and he does not recommend re-excision. The surgical margins however are close along the lateral margin at 1 to 2 mm. One option would be for the patient to consider re-excision and likely avoid radiation therapy but the pt does not wish to have additional surgery. We discussed options for management including hormonal therapy alone, radiation therapy alone, or a combination of hormonal and radiation therapy. Given the fact that the patient has had 3 breast cancers at this time with excellent performance status and health as well as a relatively close surgical margin, the patient would like to proceed with radiation therapy as part of her overall management.   PLAN: The patient has been scheduled for simulation and treatment planning on Wednesday,  11/9, at 11 am.       ------------------------------------------------  Blair Promise, PhD, MD   This document serves as a record of services personally performed by Gery Pray, MD. It was created on his behalf by Arlyce Harman, a trained medical scribe. The creation of this record is based on the scribe's personal observations and the provider's statements to them. This document has been checked and approved by the attending provider.

## 2015-07-20 NOTE — Addendum Note (Signed)
Encounter addended by: Jacqulyn Liner, RN on: 07/20/2015  3:41 PM<BR>     Documentation filed: Charges VN

## 2015-07-21 ENCOUNTER — Encounter: Payer: Self-pay | Admitting: Cardiology

## 2015-07-25 ENCOUNTER — Other Ambulatory Visit: Payer: Self-pay | Admitting: Cardiology

## 2015-07-26 ENCOUNTER — Ambulatory Visit: Payer: Medicare Other | Admitting: Radiation Oncology

## 2015-07-27 ENCOUNTER — Encounter: Payer: Self-pay | Admitting: Oncology

## 2015-07-27 ENCOUNTER — Ambulatory Visit
Admission: RE | Admit: 2015-07-27 | Discharge: 2015-07-27 | Disposition: A | Discharge status: Home / Self Care | Payer: Medicare Other | Source: Ambulatory Visit | Attending: Radiation Oncology | Admitting: Radiation Oncology

## 2015-07-27 DIAGNOSIS — Z510 Encounter for antineoplastic radiation therapy: Secondary | ICD-10-CM | POA: Diagnosis not present

## 2015-07-27 DIAGNOSIS — E785 Hyperlipidemia, unspecified: Secondary | ICD-10-CM | POA: Diagnosis not present

## 2015-07-27 DIAGNOSIS — Z923 Personal history of irradiation: Secondary | ICD-10-CM | POA: Diagnosis not present

## 2015-07-27 DIAGNOSIS — C50212 Malignant neoplasm of upper-inner quadrant of left female breast: Secondary | ICD-10-CM | POA: Diagnosis not present

## 2015-07-27 DIAGNOSIS — Z170 Estrogen receptor positive status [ER+]: Secondary | ICD-10-CM | POA: Diagnosis not present

## 2015-07-27 DIAGNOSIS — I10 Essential (primary) hypertension: Secondary | ICD-10-CM | POA: Diagnosis not present

## 2015-07-28 NOTE — Progress Notes (Signed)
  Radiation Oncology         [phone] ________________________________  Name: Tammy Oneill MRN: [id number]  Date: 07/27/2015  DOB: Apr 04, 1933  Special treatment procedure note    ICD-9-CM ICD-10-CM   1. Breast cancer of upper-inner quadrant of left female breast ([facility]) 174.2 C50.212     Status: outpatient  NARRATIVE: The patient has prior history of radiation therapy directed at the right breast area. Additional time was taken in reviewing the patient's previous treatment as it relates to her current set up,  given this increased time as well as the potential for increased toxicities with overlapping fields, this constitutes a special treatment procedure. -----------------------------------  Blair Promise, PhD, MD

## 2015-07-28 NOTE — Progress Notes (Signed)
  Radiation Oncology         [phone] ________________________________  Name: Tammy Oneill MRN: [id number]  Date: 07/27/2015  DOB: Feb 25, 1933  SIMULATION AND TREATMENT PLANNING NOTE    ICD-9-CM ICD-10-CM   1. 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  NARRATIVE:  The patient was brought to the [facility].  Identity was confirmed.  All relevant records and images related to the planned course of therapy were reviewed.  The patient freely provided informed written consent to proceed with treatment after reviewing the details related to the planned course of therapy. The consent form was witnessed and verified by the simulation staff.  Then, the patient was set-up in a stable reproducible  supine position for radiation therapy.  CT images were obtained.  Surface markings were placed.  The CT images were loaded into the planning software.  Then the target and avoidance structures were contoured.  Treatment planning then occurred.  The radiation prescription was entered and confirmed.  Then, I designed and supervised the construction of a total of 4 medically necessary complex treatment devices.  I have requested : 3D Simulation  I have requested a DVH of the following structures: lungs, heart, lumpectomy cavity.  I have ordered:dose calc.  PLAN:  The patient will receive 42.72 Gy in 16 fractions followed by a boost to the lumpectomy cavity of 10 gray.  ________________________________  -----------------------------------  Blair Promise, PhD, MD

## 2015-08-02 DIAGNOSIS — Z170 Estrogen receptor positive status [ER+]: Secondary | ICD-10-CM | POA: Diagnosis not present

## 2015-08-02 DIAGNOSIS — C50212 Malignant neoplasm of upper-inner quadrant of left female breast: Secondary | ICD-10-CM | POA: Diagnosis not present

## 2015-08-02 DIAGNOSIS — E785 Hyperlipidemia, unspecified: Secondary | ICD-10-CM | POA: Diagnosis not present

## 2015-08-02 DIAGNOSIS — Z923 Personal history of irradiation: Secondary | ICD-10-CM | POA: Diagnosis not present

## 2015-08-02 DIAGNOSIS — Z510 Encounter for antineoplastic radiation therapy: Secondary | ICD-10-CM | POA: Diagnosis not present

## 2015-08-02 DIAGNOSIS — I10 Essential (primary) hypertension: Secondary | ICD-10-CM | POA: Diagnosis not present

## 2015-08-03 DIAGNOSIS — Z923 Personal history of irradiation: Secondary | ICD-10-CM | POA: Diagnosis not present

## 2015-08-03 DIAGNOSIS — C50212 Malignant neoplasm of upper-inner quadrant of left female breast: Secondary | ICD-10-CM | POA: Diagnosis not present

## 2015-08-03 DIAGNOSIS — Z510 Encounter for antineoplastic radiation therapy: Secondary | ICD-10-CM | POA: Diagnosis not present

## 2015-08-03 DIAGNOSIS — E785 Hyperlipidemia, unspecified: Secondary | ICD-10-CM | POA: Diagnosis not present

## 2015-08-03 DIAGNOSIS — Z170 Estrogen receptor positive status [ER+]: Secondary | ICD-10-CM | POA: Diagnosis not present

## 2015-08-03 DIAGNOSIS — I10 Essential (primary) hypertension: Secondary | ICD-10-CM | POA: Diagnosis not present

## 2015-08-04 DIAGNOSIS — Z170 Estrogen receptor positive status [ER+]: Secondary | ICD-10-CM | POA: Diagnosis not present

## 2015-08-04 DIAGNOSIS — E785 Hyperlipidemia, unspecified: Secondary | ICD-10-CM | POA: Diagnosis not present

## 2015-08-04 DIAGNOSIS — Z923 Personal history of irradiation: Secondary | ICD-10-CM | POA: Diagnosis not present

## 2015-08-04 DIAGNOSIS — I10 Essential (primary) hypertension: Secondary | ICD-10-CM | POA: Diagnosis not present

## 2015-08-04 DIAGNOSIS — Z510 Encounter for antineoplastic radiation therapy: Secondary | ICD-10-CM | POA: Diagnosis not present

## 2015-08-04 DIAGNOSIS — C50212 Malignant neoplasm of upper-inner quadrant of left female breast: Secondary | ICD-10-CM | POA: Diagnosis not present

## 2015-08-05 DIAGNOSIS — Z510 Encounter for antineoplastic radiation therapy: Secondary | ICD-10-CM | POA: Diagnosis not present

## 2015-08-05 DIAGNOSIS — Z923 Personal history of irradiation: Secondary | ICD-10-CM | POA: Diagnosis not present

## 2015-08-05 DIAGNOSIS — C50212 Malignant neoplasm of upper-inner quadrant of left female breast: Secondary | ICD-10-CM | POA: Diagnosis not present

## 2015-08-05 DIAGNOSIS — I10 Essential (primary) hypertension: Secondary | ICD-10-CM | POA: Diagnosis not present

## 2015-08-05 DIAGNOSIS — E785 Hyperlipidemia, unspecified: Secondary | ICD-10-CM | POA: Diagnosis not present

## 2015-08-05 DIAGNOSIS — Z170 Estrogen receptor positive status [ER+]: Secondary | ICD-10-CM | POA: Diagnosis not present

## 2015-08-07 ENCOUNTER — Ambulatory Visit
Admission: RE | Admit: 2015-08-07 | Discharge: 2015-08-07 | Disposition: A | Discharge status: Home / Self Care | Payer: Medicare Other | Source: Ambulatory Visit | Attending: Radiation Oncology | Admitting: Radiation Oncology

## 2015-08-07 DIAGNOSIS — E785 Hyperlipidemia, unspecified: Secondary | ICD-10-CM | POA: Diagnosis not present

## 2015-08-07 DIAGNOSIS — I10 Essential (primary) hypertension: Secondary | ICD-10-CM | POA: Diagnosis not present

## 2015-08-07 DIAGNOSIS — C50212 Malignant neoplasm of upper-inner quadrant of left female breast: Secondary | ICD-10-CM | POA: Diagnosis not present

## 2015-08-07 DIAGNOSIS — Z510 Encounter for antineoplastic radiation therapy: Secondary | ICD-10-CM | POA: Diagnosis not present

## 2015-08-07 DIAGNOSIS — Z170 Estrogen receptor positive status [ER+]: Secondary | ICD-10-CM | POA: Diagnosis not present

## 2015-08-07 DIAGNOSIS — Z923 Personal history of irradiation: Secondary | ICD-10-CM | POA: Diagnosis not present

## 2015-08-08 ENCOUNTER — Ambulatory Visit
Admission: RE | Admit: 2015-08-08 | Discharge: 2015-08-08 | Disposition: A | Discharge status: Home / Self Care | Payer: Medicare Other | Source: Ambulatory Visit | Attending: Radiation Oncology | Admitting: Radiation Oncology

## 2015-08-08 DIAGNOSIS — C50212 Malignant neoplasm of upper-inner quadrant of left female breast: Secondary | ICD-10-CM | POA: Diagnosis not present

## 2015-08-08 DIAGNOSIS — I10 Essential (primary) hypertension: Secondary | ICD-10-CM | POA: Diagnosis not present

## 2015-08-08 DIAGNOSIS — Z170 Estrogen receptor positive status [ER+]: Secondary | ICD-10-CM | POA: Diagnosis not present

## 2015-08-08 DIAGNOSIS — E785 Hyperlipidemia, unspecified: Secondary | ICD-10-CM | POA: Diagnosis not present

## 2015-08-08 DIAGNOSIS — Z923 Personal history of irradiation: Secondary | ICD-10-CM | POA: Diagnosis not present

## 2015-08-08 DIAGNOSIS — Z510 Encounter for antineoplastic radiation therapy: Secondary | ICD-10-CM | POA: Diagnosis not present

## 2015-08-09 ENCOUNTER — Ambulatory Visit
Admission: RE | Admit: 2015-08-09 | Discharge: 2015-08-09 | Disposition: A | Discharge status: Home / Self Care | Payer: Medicare Other | Source: Ambulatory Visit | Attending: Radiation Oncology | Admitting: Radiation Oncology

## 2015-08-09 ENCOUNTER — Encounter: Payer: Self-pay | Admitting: Radiation Oncology

## 2015-08-09 VITALS — BP 145/49 | HR 63 | Temp 97.5°F | Resp 18 | Wt 143.3 lb

## 2015-08-09 DIAGNOSIS — I10 Essential (primary) hypertension: Secondary | ICD-10-CM | POA: Diagnosis not present

## 2015-08-09 DIAGNOSIS — C50212 Malignant neoplasm of upper-inner quadrant of left female breast: Secondary | ICD-10-CM | POA: Diagnosis not present

## 2015-08-09 DIAGNOSIS — Z170 Estrogen receptor positive status [ER+]: Secondary | ICD-10-CM | POA: Diagnosis not present

## 2015-08-09 DIAGNOSIS — E785 Hyperlipidemia, unspecified: Secondary | ICD-10-CM | POA: Diagnosis not present

## 2015-08-09 DIAGNOSIS — Z510 Encounter for antineoplastic radiation therapy: Secondary | ICD-10-CM | POA: Diagnosis not present

## 2015-08-09 DIAGNOSIS — Z923 Personal history of irradiation: Secondary | ICD-10-CM | POA: Diagnosis not present

## 2015-08-09 MED ORDER — RADIAPLEXRX EX GEL
Freq: Once | CUTANEOUS | Status: AC
  Administered 2015-08-09: 17:00:00 via TOPICAL

## 2015-08-09 NOTE — Progress Notes (Signed)
  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   Status: outpatient  NARRATIVE: Today the patient underwent additional planning for radiation therapy directed at the left breast. The patient's treatment planning CT scan was reviewed and the patient had set up of a custom electron cutout field directed at the lumpectomy cavity within the upper inner aspect of the left breast. The patient will be treated with 9 MeV electrons prescribed to the 100% isodose line. An electron monte Carlo isodose plan was generated for treatment.. The patient received 5 additional treatments at 2 gray per fraction for a boost dose of 10 gray. -----------------------------------  Blair Promise, PhD, MD

## 2015-08-09 NOTE — Progress Notes (Signed)
  Radiation Oncology         [phone] ________________________________  Name: Tammy Oneill MRN: [id number]  Date: 08/09/2015  DOB: 10-02-1932  Weekly Radiation Therapy Management    ICD-9-CM ICD-10-CM   1. Breast cancer of upper-inner quadrant of left female breast ([facility]) 174.2 C50.212      Current Dose: 13.35 Gy     Planned Dose:  52.72 Gy  Narrative . . . . . . . . The patient presents for routine under treatment assessment.                                   The patient is without complaint.                                  Set-up films were reviewed.                                  The chart was checked. Physical Findings. . .  weight is 143 lb 4.8 oz (65 kg). Her temperature is 97.5 F (36.4 C). Her blood pressure is 145/49 and her pulse is 63. Her respiration is 18 and oxygen saturation is 99%. . The lungs are clear. The heart has a regular rhythm and rate. The left breast area shows no significant skin reaction.  Impression . . . . . . . The patient is tolerating radiation. Plan . . . . . . . . . . . . Continue treatment as planned.  ________________________________   Blair Promise, PhD, MD

## 2015-08-09 NOTE — Progress Notes (Signed)
Tammy Oneill has completed 5 fractions to her left breast.  She denies pain.  She reports that her energy level is not as good as it used to be.  The skin on her left breast is intact.    BP 145/49 mmHg  Pulse 63  Temp(Src) 97.5 F (36.4 C)  Resp 18  Wt 143 lb 4.8 oz (65 kg)  SpO2 99%

## 2015-08-09 NOTE — Progress Notes (Signed)
Pt here for patient teaching.  Pt given Radiation and You booklet and Radiaplex gel. Reviewed areas of pertinence such as fatigue and skin changes . Pt able to give teach back of to pat skin and use unscented/gentle soap,apply Radiaplex bid and avoid applying anything to skin within 4 hours of treatment. Pt demonstrated understanding and verbalizes understanding of information given and will contact nursing with any questions or concerns.         

## 2015-08-10 ENCOUNTER — Ambulatory Visit
Admission: RE | Admit: 2015-08-10 | Discharge: 2015-08-10 | Disposition: A | Discharge status: Home / Self Care | Payer: Medicare Other | Source: Ambulatory Visit | Attending: Radiation Oncology | Admitting: Radiation Oncology

## 2015-08-10 DIAGNOSIS — I10 Essential (primary) hypertension: Secondary | ICD-10-CM | POA: Diagnosis not present

## 2015-08-10 DIAGNOSIS — C50212 Malignant neoplasm of upper-inner quadrant of left female breast: Secondary | ICD-10-CM | POA: Diagnosis not present

## 2015-08-10 DIAGNOSIS — Z170 Estrogen receptor positive status [ER+]: Secondary | ICD-10-CM | POA: Diagnosis not present

## 2015-08-10 DIAGNOSIS — Z510 Encounter for antineoplastic radiation therapy: Secondary | ICD-10-CM | POA: Diagnosis not present

## 2015-08-10 DIAGNOSIS — E785 Hyperlipidemia, unspecified: Secondary | ICD-10-CM | POA: Diagnosis not present

## 2015-08-10 DIAGNOSIS — Z923 Personal history of irradiation: Secondary | ICD-10-CM | POA: Diagnosis not present

## 2015-08-12 ENCOUNTER — Encounter: Payer: Self-pay | Admitting: *Deleted

## 2015-08-12 ENCOUNTER — Telehealth: Payer: Self-pay | Admitting: *Deleted

## 2015-08-12 NOTE — Telephone Encounter (Signed)
Spoke with patient to follow up after start of radiation.  She states she is doing well.  Encouraged her to call with any needs or concerns. 

## 2015-08-15 ENCOUNTER — Ambulatory Visit
Admission: RE | Admit: 2015-08-15 | Discharge: 2015-08-15 | Disposition: A | Discharge status: Home / Self Care | Payer: Medicare Other | Source: Ambulatory Visit | Attending: Radiation Oncology | Admitting: Radiation Oncology

## 2015-08-15 DIAGNOSIS — C50212 Malignant neoplasm of upper-inner quadrant of left female breast: Secondary | ICD-10-CM | POA: Diagnosis not present

## 2015-08-15 DIAGNOSIS — I10 Essential (primary) hypertension: Secondary | ICD-10-CM | POA: Diagnosis not present

## 2015-08-15 DIAGNOSIS — Z923 Personal history of irradiation: Secondary | ICD-10-CM | POA: Diagnosis not present

## 2015-08-15 DIAGNOSIS — Z510 Encounter for antineoplastic radiation therapy: Secondary | ICD-10-CM | POA: Diagnosis not present

## 2015-08-15 DIAGNOSIS — E785 Hyperlipidemia, unspecified: Secondary | ICD-10-CM | POA: Diagnosis not present

## 2015-08-15 DIAGNOSIS — Z170 Estrogen receptor positive status [ER+]: Secondary | ICD-10-CM | POA: Diagnosis not present

## 2015-08-16 ENCOUNTER — Encounter: Payer: Self-pay | Admitting: Radiation Oncology

## 2015-08-16 ENCOUNTER — Ambulatory Visit
Admission: RE | Admit: 2015-08-16 | Discharge: 2015-08-16 | Disposition: A | Discharge status: Home / Self Care | Payer: Medicare Other | Source: Ambulatory Visit | Attending: Radiation Oncology | Admitting: Radiation Oncology

## 2015-08-16 VITALS — BP 121/56 | HR 67 | Temp 98.1°F | Ht 62.0 in | Wt 142.8 lb

## 2015-08-16 DIAGNOSIS — Z923 Personal history of irradiation: Secondary | ICD-10-CM | POA: Diagnosis not present

## 2015-08-16 DIAGNOSIS — E785 Hyperlipidemia, unspecified: Secondary | ICD-10-CM | POA: Diagnosis not present

## 2015-08-16 DIAGNOSIS — C50212 Malignant neoplasm of upper-inner quadrant of left female breast: Secondary | ICD-10-CM

## 2015-08-16 DIAGNOSIS — Z170 Estrogen receptor positive status [ER+]: Secondary | ICD-10-CM | POA: Diagnosis not present

## 2015-08-16 DIAGNOSIS — I10 Essential (primary) hypertension: Secondary | ICD-10-CM | POA: Diagnosis not present

## 2015-08-16 DIAGNOSIS — Z510 Encounter for antineoplastic radiation therapy: Secondary | ICD-10-CM | POA: Diagnosis not present

## 2015-08-16 NOTE — Progress Notes (Signed)
  Radiation Oncology         (336) [phone] ________________________________  Name: Tammy Oneill MRN: [id number]  Date: 08/16/2015  DOB: April 11, 1933  Weekly Radiation Therapy Management    ICD-9-CM ICD-10-CM   1. Breast cancer of upper-inner quadrant of left female breast (HCC) 174.2 C50.212      Current Dose: 21..36 Gy     Planned Dose:  52.72 Gy  Narrative . . . . . . . . The patient presents for routine under treatment assessment.                                   The patient is without complaint. Minimal fatigue. She denies any itching or discomfort within the breast                                 Set-up films were reviewed.                                 The chart was checked. Physical Findings. . .  height is 5\' 2"  (1.575 m) and weight is 142 lb 12.8 oz (64.774 kg). Her oral temperature is 98.1 F (36.7 C). Her blood pressure is 121/56 and her pulse is 67. . The lungs are clear. The heart has a regular rhythm and rate. The left breast shows mild hyperpigmentation changes. Impression . . . . . . . The patient is tolerating radiation. Plan . . . . . . . . . . . . Continue treatment as planned.  ________________________________   Blair Promise, PhD, MD

## 2015-08-16 NOTE — Progress Notes (Signed)
Tammy Oneill has completed 8 fractions to her left breast.  She denies pain.  She reports having slight fatigue.  The skin on her left breast is pink.  She is using radiaplex gel.  BP 121/56 mmHg  Pulse 67  Temp(Src) 98.1 F (36.7 C) (Oral)  Ht 5\' 2"  (1.575 m)  Wt 142 lb 12.8 oz (64.774 kg)  BMI 26.11 kg/m2

## 2015-08-17 ENCOUNTER — Ambulatory Visit
Admission: RE | Admit: 2015-08-17 | Discharge: 2015-08-17 | Disposition: A | Discharge status: Home / Self Care | Payer: Medicare Other | Source: Ambulatory Visit | Attending: Radiation Oncology | Admitting: Radiation Oncology

## 2015-08-17 DIAGNOSIS — I10 Essential (primary) hypertension: Secondary | ICD-10-CM | POA: Diagnosis not present

## 2015-08-17 DIAGNOSIS — E785 Hyperlipidemia, unspecified: Secondary | ICD-10-CM | POA: Diagnosis not present

## 2015-08-17 DIAGNOSIS — Z510 Encounter for antineoplastic radiation therapy: Secondary | ICD-10-CM | POA: Diagnosis not present

## 2015-08-17 DIAGNOSIS — Z923 Personal history of irradiation: Secondary | ICD-10-CM | POA: Diagnosis not present

## 2015-08-17 DIAGNOSIS — C50212 Malignant neoplasm of upper-inner quadrant of left female breast: Secondary | ICD-10-CM | POA: Diagnosis not present

## 2015-08-17 DIAGNOSIS — Z170 Estrogen receptor positive status [ER+]: Secondary | ICD-10-CM | POA: Diagnosis not present

## 2015-08-18 ENCOUNTER — Ambulatory Visit
Admission: RE | Admit: 2015-08-18 | Discharge: 2015-08-18 | Disposition: A | Discharge status: Home / Self Care | Payer: Medicare Other | Source: Ambulatory Visit | Attending: Radiation Oncology | Admitting: Radiation Oncology

## 2015-08-18 DIAGNOSIS — E785 Hyperlipidemia, unspecified: Secondary | ICD-10-CM | POA: Diagnosis not present

## 2015-08-18 DIAGNOSIS — Z510 Encounter for antineoplastic radiation therapy: Secondary | ICD-10-CM | POA: Diagnosis not present

## 2015-08-18 DIAGNOSIS — C50212 Malignant neoplasm of upper-inner quadrant of left female breast: Secondary | ICD-10-CM | POA: Diagnosis not present

## 2015-08-18 DIAGNOSIS — Z923 Personal history of irradiation: Secondary | ICD-10-CM | POA: Diagnosis not present

## 2015-08-18 DIAGNOSIS — I10 Essential (primary) hypertension: Secondary | ICD-10-CM | POA: Diagnosis not present

## 2015-08-18 DIAGNOSIS — Z170 Estrogen receptor positive status [ER+]: Secondary | ICD-10-CM | POA: Diagnosis not present

## 2015-08-19 ENCOUNTER — Ambulatory Visit
Admission: RE | Admit: 2015-08-19 | Discharge: 2015-08-19 | Disposition: A | Discharge status: Home / Self Care | Payer: Medicare Other | Source: Ambulatory Visit | Attending: Radiation Oncology | Admitting: Radiation Oncology

## 2015-08-19 DIAGNOSIS — I10 Essential (primary) hypertension: Secondary | ICD-10-CM | POA: Diagnosis not present

## 2015-08-19 DIAGNOSIS — Z170 Estrogen receptor positive status [ER+]: Secondary | ICD-10-CM | POA: Diagnosis not present

## 2015-08-19 DIAGNOSIS — Z510 Encounter for antineoplastic radiation therapy: Secondary | ICD-10-CM | POA: Diagnosis not present

## 2015-08-19 DIAGNOSIS — E785 Hyperlipidemia, unspecified: Secondary | ICD-10-CM | POA: Diagnosis not present

## 2015-08-19 DIAGNOSIS — Z923 Personal history of irradiation: Secondary | ICD-10-CM | POA: Diagnosis not present

## 2015-08-19 DIAGNOSIS — C50212 Malignant neoplasm of upper-inner quadrant of left female breast: Secondary | ICD-10-CM | POA: Diagnosis not present

## 2015-08-22 ENCOUNTER — Ambulatory Visit
Admission: RE | Admit: 2015-08-22 | Discharge: 2015-08-22 | Disposition: A | Discharge status: Home / Self Care | Payer: Medicare Other | Source: Ambulatory Visit | Attending: Radiation Oncology | Admitting: Radiation Oncology

## 2015-08-22 DIAGNOSIS — C50212 Malignant neoplasm of upper-inner quadrant of left female breast: Secondary | ICD-10-CM | POA: Diagnosis not present

## 2015-08-22 DIAGNOSIS — Z170 Estrogen receptor positive status [ER+]: Secondary | ICD-10-CM | POA: Diagnosis not present

## 2015-08-22 DIAGNOSIS — I10 Essential (primary) hypertension: Secondary | ICD-10-CM | POA: Diagnosis not present

## 2015-08-22 DIAGNOSIS — Z510 Encounter for antineoplastic radiation therapy: Secondary | ICD-10-CM | POA: Diagnosis not present

## 2015-08-22 DIAGNOSIS — Z923 Personal history of irradiation: Secondary | ICD-10-CM | POA: Diagnosis not present

## 2015-08-22 DIAGNOSIS — E785 Hyperlipidemia, unspecified: Secondary | ICD-10-CM | POA: Diagnosis not present

## 2015-08-23 ENCOUNTER — Ambulatory Visit
Admission: RE | Admit: 2015-08-23 | Discharge: 2015-08-23 | Disposition: A | Discharge status: Home / Self Care | Payer: Medicare Other | Source: Ambulatory Visit | Attending: Radiation Oncology | Admitting: Radiation Oncology

## 2015-08-23 ENCOUNTER — Encounter: Payer: Self-pay | Admitting: Radiation Oncology

## 2015-08-23 VITALS — BP 139/67 | HR 65 | Temp 97.7°F | Resp 16 | Ht 62.0 in | Wt 143.0 lb

## 2015-08-23 DIAGNOSIS — Z923 Personal history of irradiation: Secondary | ICD-10-CM | POA: Diagnosis not present

## 2015-08-23 DIAGNOSIS — E785 Hyperlipidemia, unspecified: Secondary | ICD-10-CM | POA: Diagnosis not present

## 2015-08-23 DIAGNOSIS — C50212 Malignant neoplasm of upper-inner quadrant of left female breast: Secondary | ICD-10-CM | POA: Diagnosis not present

## 2015-08-23 DIAGNOSIS — Z170 Estrogen receptor positive status [ER+]: Secondary | ICD-10-CM | POA: Diagnosis not present

## 2015-08-23 DIAGNOSIS — I10 Essential (primary) hypertension: Secondary | ICD-10-CM | POA: Diagnosis not present

## 2015-08-23 DIAGNOSIS — Z510 Encounter for antineoplastic radiation therapy: Secondary | ICD-10-CM | POA: Diagnosis not present

## 2015-08-23 NOTE — Progress Notes (Signed)
Tammy Oneill has completed 13 fractions to her left breast.  She denies pain and fatigue.  The skin on her left breast is red with a scattered rash.  She is using radiaplex.    BP 139/67 mmHg  Pulse 65  Temp(Src) 97.7 F (36.5 C) (Oral)  Resp 16  Ht 5\' 2"  (1.575 m)  Wt 143 lb (64.864 kg)  BMI 26.15 kg/m2

## 2015-08-23 NOTE — Progress Notes (Signed)
  Radiation Oncology         (336) [phone] ________________________________  Name: Tammy Oneill MRN: [id number]  Date: 08/23/2015  DOB: 04/13/1933  Weekly Radiation Therapy Management    ICD-9-CM ICD-10-CM   1. Breast cancer of upper-inner quadrant of left female breast (HCC) 174.2 C50.212      Current Dose: 34.71 Gy     Planned Dose:  52.72 Gy  Narrative . . . . . . . . The patient presents for routine under treatment assessment.                                   The patient is without complaint.  She has noticed some mild itching within the breast area and fatigue                                 Set-up films were reviewed.                                 The chart was checked. Physical Findings. . .  height is 5\' 2"  (1.575 m) and weight is 143 lb (64.864 kg). Her oral temperature is 97.7 F (36.5 C). Her blood pressure is 139/67 and her pulse is 65. Her respiration is 16. . Weight essentially stable. The lungs are clear. The heart has regular rhythm and rate. The left breast area shows some mild erythema. No skin breakdown Impression . . . . . . . The patient is tolerating radiation. Plan . . . . . . . . . . . . Continue treatment as planned.  ________________________________   Blair Promise, PhD, MD

## 2015-08-24 ENCOUNTER — Ambulatory Visit
Admission: RE | Admit: 2015-08-24 | Discharge: 2015-08-24 | Disposition: A | Discharge status: Home / Self Care | Payer: Medicare Other | Source: Ambulatory Visit | Attending: Radiation Oncology | Admitting: Radiation Oncology

## 2015-08-24 DIAGNOSIS — C50212 Malignant neoplasm of upper-inner quadrant of left female breast: Secondary | ICD-10-CM | POA: Diagnosis not present

## 2015-08-24 DIAGNOSIS — E785 Hyperlipidemia, unspecified: Secondary | ICD-10-CM | POA: Diagnosis not present

## 2015-08-24 DIAGNOSIS — Z923 Personal history of irradiation: Secondary | ICD-10-CM | POA: Diagnosis not present

## 2015-08-24 DIAGNOSIS — Z510 Encounter for antineoplastic radiation therapy: Secondary | ICD-10-CM | POA: Diagnosis not present

## 2015-08-24 DIAGNOSIS — I10 Essential (primary) hypertension: Secondary | ICD-10-CM | POA: Diagnosis not present

## 2015-08-24 DIAGNOSIS — Z170 Estrogen receptor positive status [ER+]: Secondary | ICD-10-CM | POA: Diagnosis not present

## 2015-08-24 NOTE — Progress Notes (Signed)
  Radiation Oncology         [phone] ________________________________  Name: Tammy Oneill MRN: [id number]  Date: 08/24/2015  DOB: [birth year]  Electron Beam Setup Verification Note    ICD-9-CM ICD-10-CM   1. Breast cancer of upper-inner quadrant of left female breast ([place]) 174.2 C50.212     Status: outpatient  NARRATIVE: The patient was brought to the treatment unit and placed in the planned treatment position. The clinical setup was verified. Then port films were obtained and uploaded to the radiation oncology medical record software.  The treatment beams were carefully compared against the planned radiation fields. The position location and shape of the radiation fields was reviewed. They targeted volume of tissue appears to be appropriately covered by the radiation beams. Organs at risk appear to be excluded as planned.  Based on my personal review, I approved the simulation verification. The patient's treatment will proceed as planned.  -----------------------------------  Blair Promise, PhD, MD

## 2015-08-24 NOTE — Progress Notes (Deleted)
  Radiation Oncology         [phone] ________________________________  Name: Tammy Oneill MRN: [id number]  Date: 08/24/2015  DOB: 1932/11/11  Simulation Verification Note    ICD-9-CM ICD-10-CM   1. Breast cancer of upper-inner quadrant of left female breast ([place]) 174.2 C50.212     Status: outpatient  NARRATIVE: The patient was brought to the treatment unit and placed in the planned treatment position. The clinical setup was verified. Then port films were obtained and uploaded to the radiation oncology medical record software.  The treatment beams were carefully compared against the planned radiation fields. The position location and shape of the radiation fields was reviewed. They targeted volume of tissue appears to be appropriately covered by the radiation beams. Organs at risk appear to be excluded as planned.  Based on my personal review, I approved the simulation verification. The patient's treatment will proceed as planned.  -----------------------------------  Blair Promise, PhD, MD

## 2015-08-25 ENCOUNTER — Ambulatory Visit
Admission: RE | Admit: 2015-08-25 | Discharge: 2015-08-25 | Disposition: A | Discharge status: Home / Self Care | Payer: Medicare Other | Source: Ambulatory Visit | Attending: Radiation Oncology | Admitting: Radiation Oncology

## 2015-08-25 DIAGNOSIS — I10 Essential (primary) hypertension: Secondary | ICD-10-CM | POA: Diagnosis not present

## 2015-08-25 DIAGNOSIS — Z510 Encounter for antineoplastic radiation therapy: Secondary | ICD-10-CM | POA: Diagnosis not present

## 2015-08-25 DIAGNOSIS — C50212 Malignant neoplasm of upper-inner quadrant of left female breast: Secondary | ICD-10-CM | POA: Diagnosis not present

## 2015-08-25 DIAGNOSIS — Z170 Estrogen receptor positive status [ER+]: Secondary | ICD-10-CM | POA: Diagnosis not present

## 2015-08-25 DIAGNOSIS — E785 Hyperlipidemia, unspecified: Secondary | ICD-10-CM | POA: Diagnosis not present

## 2015-08-25 DIAGNOSIS — Z923 Personal history of irradiation: Secondary | ICD-10-CM | POA: Diagnosis not present

## 2015-08-26 ENCOUNTER — Ambulatory Visit
Admission: RE | Admit: 2015-08-26 | Discharge: 2015-08-26 | Disposition: A | Discharge status: Home / Self Care | Payer: Medicare Other | Source: Ambulatory Visit | Attending: Radiation Oncology | Admitting: Radiation Oncology

## 2015-08-26 DIAGNOSIS — C50212 Malignant neoplasm of upper-inner quadrant of left female breast: Secondary | ICD-10-CM | POA: Diagnosis not present

## 2015-08-26 DIAGNOSIS — Z923 Personal history of irradiation: Secondary | ICD-10-CM | POA: Diagnosis not present

## 2015-08-26 DIAGNOSIS — E785 Hyperlipidemia, unspecified: Secondary | ICD-10-CM | POA: Diagnosis not present

## 2015-08-26 DIAGNOSIS — I10 Essential (primary) hypertension: Secondary | ICD-10-CM | POA: Diagnosis not present

## 2015-08-26 DIAGNOSIS — Z510 Encounter for antineoplastic radiation therapy: Secondary | ICD-10-CM | POA: Diagnosis not present

## 2015-08-26 DIAGNOSIS — Z170 Estrogen receptor positive status [ER+]: Secondary | ICD-10-CM | POA: Diagnosis not present

## 2015-08-28 DIAGNOSIS — C50212 Malignant neoplasm of upper-inner quadrant of left female breast: Secondary | ICD-10-CM | POA: Diagnosis not present

## 2015-08-29 ENCOUNTER — Ambulatory Visit
Admission: RE | Admit: 2015-08-29 | Discharge: 2015-08-29 | Disposition: A | Discharge status: Home / Self Care | Payer: Medicare Other | Source: Ambulatory Visit | Attending: Radiation Oncology | Admitting: Radiation Oncology

## 2015-08-29 DIAGNOSIS — Z923 Personal history of irradiation: Secondary | ICD-10-CM | POA: Diagnosis not present

## 2015-08-29 DIAGNOSIS — Z170 Estrogen receptor positive status [ER+]: Secondary | ICD-10-CM | POA: Diagnosis not present

## 2015-08-29 DIAGNOSIS — I10 Essential (primary) hypertension: Secondary | ICD-10-CM | POA: Diagnosis not present

## 2015-08-29 DIAGNOSIS — C50212 Malignant neoplasm of upper-inner quadrant of left female breast: Secondary | ICD-10-CM | POA: Diagnosis not present

## 2015-08-29 DIAGNOSIS — E785 Hyperlipidemia, unspecified: Secondary | ICD-10-CM | POA: Diagnosis not present

## 2015-08-29 DIAGNOSIS — Z510 Encounter for antineoplastic radiation therapy: Secondary | ICD-10-CM | POA: Diagnosis not present

## 2015-08-30 ENCOUNTER — Encounter: Payer: Self-pay | Admitting: Radiation Oncology

## 2015-08-30 ENCOUNTER — Ambulatory Visit
Admission: RE | Admit: 2015-08-30 | Discharge: 2015-08-30 | Disposition: A | Discharge status: Home / Self Care | Payer: Medicare Other | Source: Ambulatory Visit | Attending: Radiation Oncology | Admitting: Radiation Oncology

## 2015-08-30 VITALS — BP 121/48 | HR 64 | Temp 97.6°F | Resp 16 | Ht 62.0 in | Wt 142.8 lb

## 2015-08-30 DIAGNOSIS — E785 Hyperlipidemia, unspecified: Secondary | ICD-10-CM | POA: Diagnosis not present

## 2015-08-30 DIAGNOSIS — Z923 Personal history of irradiation: Secondary | ICD-10-CM | POA: Diagnosis not present

## 2015-08-30 DIAGNOSIS — C50212 Malignant neoplasm of upper-inner quadrant of left female breast: Secondary | ICD-10-CM | POA: Insufficient documentation

## 2015-08-30 DIAGNOSIS — I10 Essential (primary) hypertension: Secondary | ICD-10-CM | POA: Diagnosis not present

## 2015-08-30 DIAGNOSIS — Z170 Estrogen receptor positive status [ER+]: Secondary | ICD-10-CM | POA: Diagnosis not present

## 2015-08-30 DIAGNOSIS — Z510 Encounter for antineoplastic radiation therapy: Secondary | ICD-10-CM | POA: Diagnosis not present

## 2015-08-30 MED ORDER — SONAFINE EX EMUL
1.0000 "application " | Freq: Two times a day (BID) | CUTANEOUS | Status: DC
  Administered 2015-08-30: 1 via TOPICAL
  Filled 2015-08-30: qty 45

## 2015-08-30 NOTE — Progress Notes (Signed)
  Radiation Oncology         [phone]) [phone] ________________________________  Name: Tammy Oneill MRN: [id number]  Date: 08/30/2015  DOB: Aug 09, 1933  Weekly Radiation Therapy Management    ICD-9-CM ICD-10-CM   1. Breast cancer of upper-inner quadrant of left female breast (HCC) 174.2 C50.212 SONAFINE emulsion 1 application     Current Dose: 46.72 Gy     Planned Dose:  52.72 Gy  Narrative . . . . . . . . The patient presents for routine under treatment assessment.                                   The patient is having itching within the breast. She is also quite sore in the nipple area                                 Set-up films were reviewed.                                 The chart was checked. Physical Findings. . .  height is 5\' 2"  (1.575 m) and weight is 142 lb 12.8 oz (64.774 kg). Her oral temperature is 97.6 F (36.4 C). Her blood pressure is 121/48 and her pulse is 64. Her respiration is 16. . The lungs are clear. The heart has a regular rhythm and rate. The left breast area shows radiation dermatitis and hyperpigmentation changes. No moist desquamation Impression . . . . . . . The patient is tolerating radiation. Plan . . . . . . . . . . . . Continue treatment as planned. The patient is been given a tube of sonafine to be placed on the breast in light of her itching  ________________________________   Blair Promise, PhD, MD

## 2015-08-30 NOTE — Progress Notes (Signed)
Tammy Oneill has completed 18 fractions to her left breast.  She denies pain but does report that her nipple area is very sore.  She reports having fatigue.  The skin on her left breast is red with a scattered rash.  She reports it is itching.  She has been using radiaplex and will be given sonafine today for itching.  BP 121/48 mmHg  Pulse 64  Temp(Src) 97.6 F (36.4 C) (Oral)  Resp 16  Ht 5\' 2"  (1.575 m)  Wt 142 lb 12.8 oz (64.774 kg)  BMI 26.11 kg/m2

## 2015-08-31 ENCOUNTER — Ambulatory Visit
Admission: RE | Admit: 2015-08-31 | Discharge: 2015-08-31 | Disposition: A | Discharge status: Home / Self Care | Payer: Medicare Other | Source: Ambulatory Visit | Attending: Radiation Oncology | Admitting: Radiation Oncology

## 2015-08-31 DIAGNOSIS — E785 Hyperlipidemia, unspecified: Secondary | ICD-10-CM | POA: Diagnosis not present

## 2015-08-31 DIAGNOSIS — I10 Essential (primary) hypertension: Secondary | ICD-10-CM | POA: Diagnosis not present

## 2015-08-31 DIAGNOSIS — Z170 Estrogen receptor positive status [ER+]: Secondary | ICD-10-CM | POA: Diagnosis not present

## 2015-08-31 DIAGNOSIS — Z510 Encounter for antineoplastic radiation therapy: Secondary | ICD-10-CM | POA: Diagnosis not present

## 2015-08-31 DIAGNOSIS — Z923 Personal history of irradiation: Secondary | ICD-10-CM | POA: Diagnosis not present

## 2015-08-31 DIAGNOSIS — C50212 Malignant neoplasm of upper-inner quadrant of left female breast: Secondary | ICD-10-CM | POA: Diagnosis not present

## 2015-09-01 ENCOUNTER — Ambulatory Visit
Admission: RE | Admit: 2015-09-01 | Discharge: 2015-09-01 | Disposition: A | Discharge status: Home / Self Care | Payer: Medicare Other | Source: Ambulatory Visit | Attending: Radiation Oncology | Admitting: Radiation Oncology

## 2015-09-01 DIAGNOSIS — I10 Essential (primary) hypertension: Secondary | ICD-10-CM | POA: Diagnosis not present

## 2015-09-01 DIAGNOSIS — Z923 Personal history of irradiation: Secondary | ICD-10-CM | POA: Diagnosis not present

## 2015-09-01 DIAGNOSIS — Z510 Encounter for antineoplastic radiation therapy: Secondary | ICD-10-CM | POA: Diagnosis not present

## 2015-09-01 DIAGNOSIS — Z170 Estrogen receptor positive status [ER+]: Secondary | ICD-10-CM | POA: Diagnosis not present

## 2015-09-01 DIAGNOSIS — E785 Hyperlipidemia, unspecified: Secondary | ICD-10-CM | POA: Diagnosis not present

## 2015-09-01 DIAGNOSIS — C50212 Malignant neoplasm of upper-inner quadrant of left female breast: Secondary | ICD-10-CM | POA: Diagnosis not present

## 2015-09-02 ENCOUNTER — Ambulatory Visit
Admission: RE | Admit: 2015-09-02 | Discharge: 2015-09-02 | Disposition: A | Discharge status: Home / Self Care | Payer: Medicare Other | Source: Ambulatory Visit | Attending: Radiation Oncology | Admitting: Radiation Oncology

## 2015-09-02 ENCOUNTER — Encounter: Payer: Self-pay | Admitting: Radiation Oncology

## 2015-09-02 VITALS — BP 131/50 | HR 68 | Temp 97.5°F | Resp 18 | Ht 62.0 in | Wt 142.8 lb

## 2015-09-02 DIAGNOSIS — Z923 Personal history of irradiation: Secondary | ICD-10-CM | POA: Diagnosis not present

## 2015-09-02 DIAGNOSIS — C50212 Malignant neoplasm of upper-inner quadrant of left female breast: Secondary | ICD-10-CM

## 2015-09-02 DIAGNOSIS — Z510 Encounter for antineoplastic radiation therapy: Secondary | ICD-10-CM | POA: Diagnosis not present

## 2015-09-02 DIAGNOSIS — I10 Essential (primary) hypertension: Secondary | ICD-10-CM | POA: Diagnosis not present

## 2015-09-02 DIAGNOSIS — Z170 Estrogen receptor positive status [ER+]: Secondary | ICD-10-CM | POA: Diagnosis not present

## 2015-09-02 DIAGNOSIS — E785 Hyperlipidemia, unspecified: Secondary | ICD-10-CM | POA: Diagnosis not present

## 2015-09-02 NOTE — Progress Notes (Signed)
  Radiation Oncology         [phone]) [phone] ________________________________  Name: Tammy Oneill MRN: [id number]  Date: 09/02/2015  DOB: 1932-12-28  Weekly Radiation Therapy Management    ICD-9-CM ICD-10-CM   1. Breast cancer of upper-inner quadrant of left female breast (HCC) 174.2 C50.212      Current Dose: 52.72 Gy     Planned Dose:  52.72 Gy  Narrative . . . . . . . . The patient presents for routine under treatment assessment.                                   The patient is without complaint. Happy to complete her radiation therapy.                                 Set-up films were reviewed.                                 The chart was checked. Physical Findings. . .  height is 5\' 2"  (1.575 m) and weight is 142 lb 12.8 oz (64.774 kg). Her oral temperature is 97.5 F (36.4 C). Her blood pressure is 131/50 and her pulse is 68. Her respiration is 18. . The lungs are clear. The heart has a regular rhythm and rate. The left breast area shows diffuse erythema particularly in the upper inner aspect of the breast but no moist desquamation. Impression . . . . . . . The patient is tolerating radiation. Plan . . . . . . . . . . . . Routine followup in 1 month.  ________________________________   Blair Promise, PhD, MD

## 2015-09-02 NOTE — Progress Notes (Signed)
Tammy Oneill has completed treatment with 21 fractions to her left breast.  She denies pain and fatigue.  She has been given a one month follow up appointment.  The skin on her left breast is red with scattered fluid filled small blisters.  She is using radiaplex and sonafine cream.    BP 131/50 mmHg  Pulse 68  Temp(Src) 97.5 F (36.4 C) (Oral)  Resp 18  Ht 5\' 2"  (1.575 m)  Wt 142 lb 12.8 oz (64.774 kg)  BMI 26.11 kg/m2

## 2015-09-05 ENCOUNTER — Telehealth: Payer: Self-pay | Admitting: *Deleted

## 2015-09-05 NOTE — Progress Notes (Signed)
  Radiation Oncology         [phone] ________________________________  Name: Tammy Oneill MRN: [id number]  Date: 09/02/2015  DOB: [birth year]  End of Treatment Note    ICD-9-CM ICD-10-CM   1. 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     Indication for treatment:  Breast conservation therapy       Radiation treatment dates:  08/04/2015-09/02/2015  Site/dose:   Left breast 42.72 gray in 16 fractions; lumpectomy cavity boost 10 gray in 5 fractions, cumulative dose 52.72 gray to the lumpectomy cavity  Beams/energy:   3-D conformal with tangential beams for initial treatment custom electron cutout field for lumpectomy cavity boost, 9 MeV electrons  Narrative: The patient tolerated radiation treatment relatively well.   She developed some itching and soreness within the left breast but no skin breakdown  Plan: The patient has completed radiation treatment. The patient will return to radiation oncology clinic for routine followup in one month. I advised them to call or return sooner if they have any questions or concerns related to their recovery or treatment.  -----------------------------------  Blair Promise, PhD, MD

## 2015-09-05 NOTE — Telephone Encounter (Signed)
Attempted to call patient to follow up after radiation completion.  Unable to leave message. No voicemail.

## 2015-09-09 ENCOUNTER — Other Ambulatory Visit: Payer: Self-pay | Admitting: Adult Health

## 2015-09-09 DIAGNOSIS — C50212 Malignant neoplasm of upper-inner quadrant of left female breast: Secondary | ICD-10-CM

## 2015-09-21 ENCOUNTER — Other Ambulatory Visit: Payer: Self-pay | Admitting: Oncology

## 2015-09-21 NOTE — Progress Notes (Unsigned)
Dr. Jana Hakim,   Good morning. I am writing to clarify a planned survivorship visit for Ms. Hornbaker. In reviewing her records, it looks like she was scheduled to be seen for a survivorship visit (I'm thinking it was supposed to be long term survivorship) from the POF from 05/11/2015. It appears that she has a new diagnosis of a second breast cancer (DCIS) and needs a visit for a survivorship care plan now that she has completed radiation. If you are good with the following, I'd like to propose that we:  - schedule her SCP visit for January 2017 and  - cancel her LTS visit with me currently set for September 2017.   You are seeing her next week (Monday January 9) for follow up.   Please let me know. Thanks!  Nira Conn

## 2015-09-26 ENCOUNTER — Ambulatory Visit: Payer: Medicare Other | Admitting: Oncology

## 2015-09-26 ENCOUNTER — Other Ambulatory Visit: Payer: Medicare Other

## 2015-09-26 ENCOUNTER — Telehealth: Payer: Self-pay | Admitting: Oncology

## 2015-09-26 NOTE — Telephone Encounter (Signed)
Returned patients call to reschedule her appt due to weaether

## 2015-09-30 ENCOUNTER — Other Ambulatory Visit: Payer: Self-pay | Admitting: Cardiology

## 2015-10-11 ENCOUNTER — Other Ambulatory Visit: Payer: Self-pay

## 2015-10-11 DIAGNOSIS — C50911 Malignant neoplasm of unspecified site of right female breast: Secondary | ICD-10-CM

## 2015-10-12 ENCOUNTER — Ambulatory Visit (HOSPITAL_BASED_OUTPATIENT_CLINIC_OR_DEPARTMENT_OTHER): Payer: Medicare Other | Admitting: Oncology

## 2015-10-12 ENCOUNTER — Telehealth: Payer: Self-pay | Admitting: Oncology

## 2015-10-12 ENCOUNTER — Other Ambulatory Visit (HOSPITAL_BASED_OUTPATIENT_CLINIC_OR_DEPARTMENT_OTHER): Payer: Medicare Other

## 2015-10-12 ENCOUNTER — Encounter: Payer: Self-pay | Admitting: Oncology

## 2015-10-12 VITALS — BP 141/56 | HR 63 | Temp 97.5°F | Resp 18 | Ht 62.0 in | Wt 145.6 lb

## 2015-10-12 DIAGNOSIS — Z79811 Long term (current) use of aromatase inhibitors: Secondary | ICD-10-CM

## 2015-10-12 DIAGNOSIS — C50212 Malignant neoplasm of upper-inner quadrant of left female breast: Secondary | ICD-10-CM

## 2015-10-12 DIAGNOSIS — Z853 Personal history of malignant neoplasm of breast: Secondary | ICD-10-CM

## 2015-10-12 DIAGNOSIS — C50911 Malignant neoplasm of unspecified site of right female breast: Secondary | ICD-10-CM

## 2015-10-12 LAB — CBC WITH DIFFERENTIAL/PLATELET
BASO%: 0.9 % (ref 0.0–2.0)
BASOS ABS: 0 10*3/uL (ref 0.0–0.1)
EOS ABS: 0.1 10*3/uL (ref 0.0–0.5)
EOS%: 1.6 % (ref 0.0–7.0)
HEMATOCRIT: 38.3 % (ref 34.8–46.6)
HEMOGLOBIN: 13.2 g/dL (ref 11.6–15.9)
LYMPH#: 1.3 10*3/uL (ref 0.9–3.3)
LYMPH%: 25.9 % (ref 14.0–49.7)
MCH: 32.7 pg (ref 25.1–34.0)
MCHC: 34.3 g/dL (ref 31.5–36.0)
MCV: 95.3 fL (ref 79.5–101.0)
MONO#: 0.6 10*3/uL (ref 0.1–0.9)
MONO%: 11.9 % (ref 0.0–14.0)
NEUT#: 3 10*3/uL (ref 1.5–6.5)
NEUT%: 59.7 % (ref 38.4–76.8)
Platelets: 158 10*3/uL (ref 145–400)
RBC: 4.02 10*6/uL (ref 3.70–5.45)
RDW: 12 % (ref 11.2–14.5)
WBC: 5 10*3/uL (ref 3.9–10.3)

## 2015-10-12 LAB — COMPREHENSIVE METABOLIC PANEL
ALBUMIN: 3.7 g/dL (ref 3.5–5.0)
ALK PHOS: 93 U/L (ref 40–150)
ALT: 10 U/L (ref 0–55)
ANION GAP: 7 meq/L (ref 3–11)
AST: 16 U/L (ref 5–34)
BUN: 22.4 mg/dL (ref 7.0–26.0)
CALCIUM: 9.3 mg/dL (ref 8.4–10.4)
CHLORIDE: 104 meq/L (ref 98–109)
CO2: 26 mEq/L (ref 22–29)
CREATININE: 1.1 mg/dL (ref 0.6–1.1)
EGFR: 48 mL/min/{1.73_m2} — ABNORMAL LOW (ref >90)
Glucose: 76 mg/dl (ref 70–140)
POTASSIUM: 4 meq/L (ref 3.5–5.1)
Sodium: 136 mEq/L (ref 136–145)
Total Bilirubin: 0.53 mg/dL (ref 0.20–1.20)
Total Protein: 6.9 g/dL (ref 6.4–8.3)

## 2015-10-12 NOTE — Telephone Encounter (Signed)
Appointments made and survivorship cancelled for now per pt req,avs printed

## 2015-10-12 NOTE — Progress Notes (Signed)
ID: Tammy Oneill   DOB: 03/04/1933  MR#: [id number]  EYC#:[id number]  CHIEF COMPLAINT: right breast cancer  CURRENT TREATMENT:tamoxifen  BREAST CANCER HISTORY: From the original intake note:   She was feeling fine, and had no particular symptoms when she had routine mammographic screening at [facility] March 02, 2010.  Her prior mammogram had been in 2008.  This time Dr. Isaiah Blakes noted an irregular area of focal asymmetry in the upper outer quadrant of the right breast, and the patient was brought back for a diagnostic mammography and right breast ultrasonography March 13, 2010.  This confirmed an irregular ill defined, mixed density/hyperdense mass in the right breast with some spiculation and distortion, which by ultrasound measured 1.2 cm.  There was also a smaller, though otherwise similar lesion more posteriorly, and that one measured 8 mm by ultrasound.  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  Both were estrogen and progesterone receptor positive and HER2-, and both had a low proliferation fraction, although morphologically there was some difference between the masses, so that at the Multidisciplinary Breast Cancer Conference this morning, it was felt most likely this were two concurrent primaries.  With this information the patient was referred to Dr. Donne Hazel, and bilateral breast MRIs were obtained January 7th.  This showed the larger mass to be 1.4 cm, the smaller one to measure 1.1 cm, but there was no other finding of significance in either breast, and there was no evidence of adenopathy.   Accordingly, the patient proceeded to definitive lumpectomies, which were performed August 17.  The final pathology from this procedure ([id number]) showed two invasive ductal carcinomas, the larger measuring 1.4 cm, the smaller 7 mm, again both ER/PR+, and HER2-.  Both were grade 1.  Margins were close,  although negative, and there was no evidence of lymphovascular invasion.  Zero of two sentinel lymph nodes were involved.  The patient is staged at T1c N0, or stage I.  Her subsequent history is as detailed below.  INTERVAL HISTORY: Tammy Oneill returns for follow up of her Ductal carcinoma in situ. Since her last visit here she completed her radiation treatments. She received 21 doses. She had no significant fatigue and minimal peeling and redness. She still has a little bit of itching under her left breast. She started tamoxifen in 09/18/2015. So far the only side effect she is having is a little vaginal discharge which is clear, not itchy, and not smelly. It doesn't significantly bother her. She obtains a drug at $19 for 3 months  REVIEW OF SYSTEMS: Murrell Has noted a slight change in her vision and is due for an eye exam. She does have cramps at times and some arthritis and joint pains which are not more intense or persistent than before. Rarely her ankles swell. His usually both ankles. She feels anxious and depressed although that surgery was not apparent in today's visit. A detailed review of systems today was otherwise stable   PAST MEDICAL HISTORY: Past Medical History  Diagnosis Date  . Hypertension   . Hyperlipidemia   . Anxiety   . GERD (gastroesophageal reflux disease)   . PONV (postoperative nausea and vomiting)   . Osteopenia     on actonel  . Bilateral breast cancer ([place])   The past medical history is significant for osteopenia, history of cervical cancer status post simple hysterectomy without salpingo oophorectomy at the age of 70, history of  osteoarthritis especially involving the right shoulder where she says she has a fairly chronic bursitis, hypercholesterolemia, history of frequent urinary tract infections, history of appendectomy, history of cataracts, not yet surgically removed (she tells me Dr. Katy Fitch is planning to do this in January), and history of "an enlarged heart"  although this was not seen in the most recent chest x-ray.    PAST SURGICAL HISTORY: Past Surgical History  Procedure Laterality Date  . Abdominal hysterectomy    . Breast surgery Right 2011    lumpectomy Dr Donne Hazel  . Breast lumpectomy with radioactive seed localization Left 06/09/2015    Procedure: BREAST LUMPECTOMY WITH RADIOACTIVE SEED LOCALIZATION;  Surgeon: Rolm Bookbinder, MD;  Location: [facility];  Service: General;  Laterality: Left;  . Appendectomy      FAMILY HISTORY The patient's mother died with bladder cancer at the age of 37.  The patient's father died with lung cancer at the age of 98.  She had one brother who died in an automobile accident. One sister is "okay".  There is no breast or ovarian cancer in the family except for the patient's older daughter, Tammy Oneill, who lives in [place], and was diagnosed with breast cancer at the age of 5. She was treated at [facility].   GYNECOLOGIC HISTORY: GX P5.  First pregnancy to term at age 26.  Hysterectomy at age 37.  She took Premarin for many years, stopping about ten years ago.   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  She has been divorced twice, lives home alone, has no pets.  Son, [name], works in a Psychologist, prison and probation services company here in [place], as does son, [name].  Daughter, Tammy Oneill in [place] had breast cancer. Daughter,Tammy Oneill is also my patient and works at [employer] here in [place]. Daughter, Tammy Oneill, works in Engineer, technical sales in [place], [place].  The patient has nine grandchildren and three great-grandchildren.  She attends [employer].      ADVANCED DIRECTIVES:  HEALTH MAINTENANCE: Social History  Substance Use Topics  . Smoking status: Never Smoker   . Smokeless tobacco: Never Used  . Alcohol Use: No     Colonoscopy:  PAP:  Bone  density:  Lipid panel:  Allergies  Allergen Reactions  . Morphine And Related     vomiting    Current Outpatient Prescriptions  Medication Sig Dispense Refill  . ACTONEL 35 MG tablet TAKE 1 TABLET BY MOUTH WEEKLY (Patient not taking: Reported on 06/20/2015) 12 tablet 1  . aspirin 81 MG tablet Take 81 mg by mouth daily.     Marland Kitchen atorvastatin (LIPITOR) 10 MG tablet Take 10 mg by mouth daily.  6  . CALCIUM-MAGNESIUM-ZINC PO Take 1 tablet by mouth 2 (two) times daily.    . hyaluronate sodium (RADIAPLEXRX) GEL Apply 1 application topically once.    . metoprolol succinate (TOPROL-XL) 25 MG 24 hr tablet Take 1/2 tablet daily 45 tablet 3  . tamoxifen (NOLVADEX) 20 MG tablet Take 1 tablet (20 mg total) by mouth daily. (Patient not taking: Reported on 07/20/2015) 90 tablet 3  . telmisartan-hydrochlorothiazide (MICARDIS HCT) 80-12.5 MG tablet TAKE 1 TABLET BY MOUTH EVERY DAY 30 tablet 0  . Wound Dressings (SONAFINE EX) Apply topically.     No current facility-administered medications for this visit.    OBJECTIVE: Elderly white womanwho appears younger than stated  age 53 Vitals:   10/12/15 1149  BP: 141/56  Pulse: 63  Temp: 97.5 F (36.4 C)  Resp: 18     Body mass index is 26.62 kg/(m^2).    ECOG FS: 0  Sclerae unicteric, pupils round and equal Oropharynx clear and moist-- no thrush or other lesions No cervical or supraclavicular adenopathy Lungs no rales or rhonchi Heart regular rate and rhythm Abd soft, nontender, positive bowel sounds MSK no focal spinal tenderness, no upper extremity lymphedema Neuro: nonfocal, well oriented, appropriate affect Breasts: both breasts are status post lumpectomy and radiation. Even though she has a little bit of itching in the underside of the left breast there is no rash there. There is no evidence of chest wall recurrence or breast cancer recurrence. Both axillae are benign.   LAB RESULTS: Lab Results  Component Value Date   WBC 5.0 10/12/2015    NEUTROABS 3.0 10/12/2015   HGB 13.2 10/12/2015   HCT 38.3 10/12/2015   MCV 95.3 10/12/2015   PLT 158 10/12/2015      Chemistry      Component Value Date/Time   NA 140 05/11/2015 1323   NA 137 03/18/2012 1433   K 5.1 05/11/2015 1323   K 4.5 03/18/2012 1433   CL 101 03/18/2012 1433   CO2 28 05/11/2015 1323   CO2 30 03/18/2012 1433   BUN 18.5 05/11/2015 1323   BUN 17 03/18/2012 1433   CREATININE 1.1 05/11/2015 1323   CREATININE 1.23* 03/18/2012 1433      Component Value Date/Time   CALCIUM 10.0 05/11/2015 1323   CALCIUM 9.7 03/18/2012 1433   ALKPHOS 121 05/11/2015 1323   ALKPHOS 68 03/18/2012 1433   AST 17 05/11/2015 1323   AST 14 03/18/2012 1433   ALT 17 05/11/2015 1323   ALT 10 03/18/2012 1433   BILITOT 0.60 05/11/2015 1323   BILITOT 0.7 03/18/2012 1433       Lab Results  Component Value Date   LABCA2 21 03/18/2012    No components found for: [id number]  No results for input(s): INR in the last 168 hours.  Urinalysis No results found for: COLORURINE  STUDIES: No results found.  ASSESSMENT: 82 y.o. BRCA negative [place] woman  (1) status post double right-sided lumpectomies August 2011 for a T1c and T1b N0, stage IA invasive ductal carcinomas, both strongly estrogen and progesterone receptor positive, HER2/neu negative, with low proliferation fractions.   (2) completed radiation therapy October 2011  (3) refused adjuvant antiestrogens.   (4) left lumpectomy 06/09/15 for DCIS, ER/PR positive, HER2/neu negative, positive margins   (5) adjuvant radiation 08/04/2015-09/02/2015 Left breast 42.72 gray in 16 fractions; lumpectomy cavity boost 10 gray in 5 fractions, cumulative dose 52.72 gray to the lumpectomy cavity  (6) started tamoxifen 09/18/2015  PLAN:  Victoriah did well with her radiation and is tolerating tamoxifen fine. The plan will be to continue tamoxifen for a total of 5 years.  She usually gets mammography in September. Were going to see her  late September and yearly thereafter.   She understands that tamoxifen will help her bone density. The slight vaginal discharge she is experiencing is going to be persistent and if it bothers her sufficiently of course she will have the option of using a pad. Otherwise we would switch to an aromatase inhibitor  She knows to call for any problems that may develop before her next visit here MAGRINAT,GUSTAV C    10/12/2015

## 2015-10-13 ENCOUNTER — Encounter: Payer: Self-pay | Admitting: Radiation Oncology

## 2015-10-13 ENCOUNTER — Ambulatory Visit
Admission: RE | Admit: 2015-10-13 | Discharge: 2015-10-13 | Disposition: A | Discharge status: Home / Self Care | Payer: Medicare Other | Source: Ambulatory Visit | Attending: Radiation Oncology | Admitting: Radiation Oncology

## 2015-10-13 VITALS — BP 136/50 | HR 58 | Temp 97.6°F | Resp 20 | Ht 62.0 in | Wt 145.4 lb

## 2015-10-13 DIAGNOSIS — C50212 Malignant neoplasm of upper-inner quadrant of left female breast: Secondary | ICD-10-CM

## 2015-10-13 DIAGNOSIS — C7951 Secondary malignant neoplasm of bone: Secondary | ICD-10-CM

## 2015-10-13 NOTE — Progress Notes (Signed)
  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   Interval Since Last Radiation: 1 month. Completed radiation 08/04/2015-09/02/2015 to the left breast with 42.72 Gy in 16 fractions plus boost to the lumpectomy cavity with 10 Gy in 5 fractions.  Narrative:  The patient returns today for routine follow-up. She is well healed. She started taking tamoxifen 20mg  daily. She has occasional shooting pains in her left breast. She saw Dr. Jana Hakim yesterday. She is feeling fatigued. She mentions that she had a little fatigue before the radiation. She denies nipple discharge or bleeding.  ALLERGIES:  is allergic to morphine and related.  Meds: Current Outpatient Prescriptions  Medication Sig Dispense Refill  . aspirin 81 MG tablet Take 81 mg by mouth daily.     Marland Kitchen atorvastatin (LIPITOR) 10 MG tablet Take 10 mg by mouth daily.  6  . Cyanocobalamin (VITAMIN B 12 PO) Take 2 tablets by mouth daily. 2 gummies  Daily in am    . metoprolol succinate (TOPROL-XL) 25 MG 24 hr tablet Take 1/2 tablet daily 45 tablet 3  . tamoxifen (NOLVADEX) 20 MG tablet Take 1 tablet (20 mg total) by mouth daily. 90 tablet 3  . telmisartan-hydrochlorothiazide (MICARDIS HCT) 80-12.5 MG tablet TAKE 1 TABLET BY MOUTH EVERY DAY 30 tablet 0   No current facility-administered medications for this encounter.    Physical Findings: The patient is in no acute distress. Patient is alert and oriented.  height is 5\' 2"  (1.575 m) and weight is 145 lb 6.4 oz (65.953 kg). Her oral temperature is 97.6 F (36.4 C). Her blood pressure is 136/50 and her pulse is 58. Her respiration is 20. Marland Kitchen  No significant changes. The lungs are clear to auscultation. The heart has a regular rhythm and rate. No  palpable subclavicular or axillary adenopathy. She is well healed. No palpable mass or nipple discharge in the right and left breast.  Lab Findings: Lab Results  Component Value Date   WBC 5.0 10/12/2015   HGB 13.2 10/12/2015   HCT 38.3 10/12/2015   MCV 95.3 10/12/2015   PLT 158 10/12/2015    Radiographic Findings: No results found.  Impression:  The patient is recovering from the effects of radiation. No signs of recurrence on clinical exam today.  Plan:  She has a mammogram scheduled September 2017. She will follow up with me in six months.  ____________________________________ -----------------------------------  Blair Promise, PhD, MD    This document serves as a record of services personally performed by Gery Pray, MD. It was created on his behalf by Lendon Collar, a trained medical scribe. The creation of this record is based on the scribe's personal observations and the provider's statements to them. This document has been checked and approved by the attending provider.

## 2015-10-13 NOTE — Progress Notes (Signed)
Follow up s/p rad tx left breast, well healed, started taking tamoxifen 20mg  daily, occasional shooting pains in left breast,  Next mammogram September 2017, saw Dr. Jana Hakim yesterday asked i taking vitamin d yet,  Energy level fatiued,  9:46 AM BP 136/50 mmHg  Pulse 58  Temp(Src) 97.6 F (36.4 C) (Oral)  Resp 20  Ht 5\' 2"  (1.575 m)  Wt 145 lb 6.4 oz (65.953 kg)  BMI 26.59 kg/m2 Wt Readings from Last 3 Encounters:  10/13/15 145 lb 6.4 oz (65.953 kg)  10/12/15 145 lb 9.6 oz (66.044 kg)  09/02/15 142 lb 12.8 oz (64.774 kg)

## 2015-10-24 DIAGNOSIS — E782 Mixed hyperlipidemia: Secondary | ICD-10-CM | POA: Diagnosis not present

## 2015-10-24 DIAGNOSIS — I10 Essential (primary) hypertension: Secondary | ICD-10-CM | POA: Diagnosis not present

## 2015-10-31 DIAGNOSIS — I10 Essential (primary) hypertension: Secondary | ICD-10-CM | POA: Diagnosis not present

## 2015-10-31 DIAGNOSIS — K219 Gastro-esophageal reflux disease without esophagitis: Secondary | ICD-10-CM | POA: Diagnosis not present

## 2015-10-31 DIAGNOSIS — E782 Mixed hyperlipidemia: Secondary | ICD-10-CM | POA: Diagnosis not present

## 2015-11-07 ENCOUNTER — Encounter: Payer: Self-pay | Admitting: Nurse Practitioner

## 2015-11-07 DIAGNOSIS — C50212 Malignant neoplasm of upper-inner quadrant of left female breast: Secondary | ICD-10-CM

## 2015-11-07 NOTE — Progress Notes (Signed)
The Survivorship Care Plan was mailed to Tammy Oneill as she reported not being able to come in to the Survivorship Clinic for an in-person visit at this time. A letter was mailed to her outlining the purpose of the content of the care plan, as well as encouraging her to reach out to me with any questions or concerns.  My business card was included in the correspondence to the patient as well.  A copy of the care plan was also routed/faxed/mailed to [facility], MD, the patient's PCP.  I will not be placing any follow-up appointments to the Survivorship Clinic for [name], but I am happy to see her at any time in the future for any survivorship concerns that may arise. Thank you for allowing me to participate in her care!  Kenn File, [place] [phone]

## 2016-04-06 ENCOUNTER — Telehealth: Payer: Self-pay | Admitting: Oncology

## 2016-04-06 NOTE — Telephone Encounter (Signed)
Called to confirm appointment. Left voice message. Appointment letter and schedule mailed. Merleen Nicely.

## 2016-05-29 ENCOUNTER — Encounter: Payer: Medicare Other | Admitting: Nurse Practitioner

## 2016-06-12 DIAGNOSIS — N644 Mastodynia: Secondary | ICD-10-CM | POA: Diagnosis not present

## 2016-06-12 DIAGNOSIS — Z853 Personal history of malignant neoplasm of breast: Secondary | ICD-10-CM | POA: Diagnosis not present

## 2016-06-14 ENCOUNTER — Other Ambulatory Visit (HOSPITAL_BASED_OUTPATIENT_CLINIC_OR_DEPARTMENT_OTHER): Payer: Medicare Other

## 2016-06-14 ENCOUNTER — Ambulatory Visit (HOSPITAL_BASED_OUTPATIENT_CLINIC_OR_DEPARTMENT_OTHER): Payer: Medicare Other | Admitting: Oncology

## 2016-06-14 VITALS — BP 137/61 | HR 52 | Temp 98.0°F | Resp 17 | Ht 62.0 in | Wt 132.8 lb

## 2016-06-14 DIAGNOSIS — Z79810 Long term (current) use of selective estrogen receptor modulators (SERMs): Secondary | ICD-10-CM

## 2016-06-14 DIAGNOSIS — Z853 Personal history of malignant neoplasm of breast: Secondary | ICD-10-CM

## 2016-06-14 DIAGNOSIS — C50911 Malignant neoplasm of unspecified site of right female breast: Secondary | ICD-10-CM

## 2016-06-14 DIAGNOSIS — C50212 Malignant neoplasm of upper-inner quadrant of left female breast: Secondary | ICD-10-CM

## 2016-06-14 DIAGNOSIS — Z170 Estrogen receptor positive status [ER+]: Secondary | ICD-10-CM

## 2016-06-14 DIAGNOSIS — C50811 Malignant neoplasm of overlapping sites of right female breast: Secondary | ICD-10-CM | POA: Insufficient documentation

## 2016-06-14 LAB — CBC WITH DIFFERENTIAL/PLATELET
BASO%: 0.6 % (ref 0.0–2.0)
Basophils Absolute: 0 10*3/uL (ref 0.0–0.1)
EOS%: 2.9 % (ref 0.0–7.0)
Eosinophils Absolute: 0.1 10*3/uL (ref 0.0–0.5)
HCT: 32.9 % — ABNORMAL LOW (ref 34.8–46.6)
HGB: 11.4 g/dL — ABNORMAL LOW (ref 11.6–15.9)
LYMPH%: 30 % (ref 14.0–49.7)
MCH: 33.6 pg (ref 25.1–34.0)
MCHC: 34.7 g/dL (ref 31.5–36.0)
MCV: 97.1 fL (ref 79.5–101.0)
MONO#: 0.4 10*3/uL (ref 0.1–0.9)
MONO%: 10 % (ref 0.0–14.0)
NEUT#: 2 10*3/uL (ref 1.5–6.5)
NEUT%: 56.5 % (ref 38.4–76.8)
Platelets: 135 10*3/uL — ABNORMAL LOW (ref 145–400)
RBC: 3.39 10*6/uL — AB (ref 3.70–5.45)
RDW: 12.2 % (ref 11.2–14.5)
WBC: 3.5 10*3/uL — ABNORMAL LOW (ref 3.9–10.3)
lymph#: 1.1 10*3/uL (ref 0.9–3.3)

## 2016-06-14 LAB — COMPREHENSIVE METABOLIC PANEL
ALT: 13 U/L (ref 0–55)
ANION GAP: 8 meq/L (ref 3–11)
AST: 14 U/L (ref 5–34)
Albumin: 3.3 g/dL — ABNORMAL LOW (ref 3.5–5.0)
Alkaline Phosphatase: 61 U/L (ref 40–150)
BUN: 17 mg/dL (ref 7.0–26.0)
CHLORIDE: 104 meq/L (ref 98–109)
CO2: 26 meq/L (ref 22–29)
CREATININE: 1 mg/dL (ref 0.6–1.1)
Calcium: 9.3 mg/dL (ref 8.4–10.4)
EGFR: 51 mL/min/{1.73_m2} — ABNORMAL LOW (ref >90)
Glucose: 115 mg/dl (ref 70–140)
POTASSIUM: 4.1 meq/L (ref 3.5–5.1)
Sodium: 139 mEq/L (ref 136–145)
Total Bilirubin: 0.55 mg/dL (ref 0.20–1.20)
Total Protein: 6.3 g/dL — ABNORMAL LOW (ref 6.4–8.3)

## 2016-06-14 MED ORDER — TAMOXIFEN CITRATE 20 MG PO TABS: 20.0000 mg | ORAL_TABLET | Freq: Every day | ORAL | 3 refills | Status: DC

## 2016-06-14 NOTE — Progress Notes (Signed)
ID: Su Monks   DOB: 1933-02-01  MR#: [id number]  TKZ#:[id number]  CHIEF COMPLAINT: right breast cancer  CURRENT TREATMENT:tamoxifen  BREAST CANCER HISTORY: From the original intake note:   She was feeling fine, and had no particular symptoms when she had routine mammographic screening at [facility] March 02, 2010.  Her prior mammogram had been in 2008.  This time Dr. Isaiah Blakes noted an irregular area of focal asymmetry in the upper outer quadrant of the right breast, and the patient was brought back for a diagnostic mammography and right breast ultrasonography March 13, 2010.  This confirmed an irregular ill defined, mixed density/hyperdense mass in the right breast with some spiculation and distortion, which by ultrasound measured 1.2 cm.  There was also a smaller, though otherwise similar lesion more posteriorly, and that one measured 8 mm by ultrasound.  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  Both were estrogen and progesterone receptor positive and HER2-, and both had a low proliferation fraction, although morphologically there was some difference between the masses, so that at the Multidisciplinary Breast Cancer Conference this morning, it was felt most likely this were two concurrent primaries.  With this information the patient was referred to Dr. Donne Hazel, and bilateral breast MRIs were obtained January 7th.  This showed the larger mass to be 1.4 cm, the smaller one to measure 1.1 cm, but there was no other finding of significance in either breast, and there was no evidence of adenopathy.   Accordingly, the patient proceeded to definitive lumpectomies, which were performed August 17.  The final pathology from this procedure ([id number]) showed two invasive ductal carcinomas, the larger measuring 1.4 cm, the smaller 7 mm, again both ER/PR+, and HER2-.  Both were grade 1.  Margins were close,  although negative, and there was no evidence of lymphovascular invasion.  Zero of two sentinel lymph nodes were involved.  The patient is staged at T1c N0, or stage I.  Her subsequent history is as detailed below.  INTERVAL HISTORY: Tammy Oneill returns for follow up of her noninvasive breast cancer.  She continues on tamoxifen. She generally tolerates it well. Hot flashes and vaginal dryness are not major concerns. She obtains it at a good price.  REVIEW OF SYSTEMS: Tammy Oneill has pains here and there which are "normal" for her age, she says. These chiefly involving the lower back in her shoulders. 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 She does all her housework, shopping, cooking, and driving. Her vision continues to be a problem in she is working with her eye doctor on this. Aside from that a detailed review of systems today was stable  PAST MEDICAL HISTORY: Past Medical History:  Diagnosis Date  . Anxiety   . Bilateral breast cancer ([place])   . GERD (gastroesophageal reflux disease)   . Hyperlipidemia   . Hypertension   . Osteopenia    on actonel  . PONV (postoperative nausea and vomiting)   . Radiation 08/04/15-09/02/15   left breast 42.72 gray, boosted o 52.72 gray  The past medical history is significant for osteopenia, history of cervical cancer status post simple hysterectomy without salpingo oophorectomy at the age of 64, history of osteoarthritis especially involving the right shoulder where she says she has a fairly chronic bursitis, hypercholesterolemia, history of frequent urinary tract infections, history of appendectomy, history of cataracts, not yet surgically removed (she tells me Dr. Katy Fitch  is planning to do this in January), and history of "an enlarged heart" although this was not seen in the most recent chest x-ray.    PAST SURGICAL HISTORY: Past Surgical History:  Procedure Laterality Date  . ABDOMINAL HYSTERECTOMY    . APPENDECTOMY    . BREAST  LUMPECTOMY WITH RADIOACTIVE SEED LOCALIZATION Left 06/09/2015   Procedure: BREAST LUMPECTOMY WITH RADIOACTIVE SEED LOCALIZATION;  Surgeon: Rolm Bookbinder, MD;  Location: [facility];  Service: General;  Laterality: Left;  . BREAST SURGERY Right 2011   lumpectomy Dr Donne Hazel    FAMILY HISTORY The patient's mother died with bladder cancer at the age of 45.  The patient's father died with lung cancer at the age of 75.  She had one brother who died in an automobile accident. One sister is "okay".  There is no breast or ovarian cancer in the family except for the patient's older daughter, Tammy Oneill, who lives in [place], and was diagnosed with breast cancer at the age of 62. She was treated at [facility].   GYNECOLOGIC HISTORY: GX P5.  First pregnancy to term at age 41.  Hysterectomy at age 18.  She took Premarin for many years, stopping about ten years ago.   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  She has been divorced twice, lives home alone, has no pets.  Son, Tammy Oneill, works in a Psychologist, prison and probation services company here in [place], as does son, Tammy Oneill..  Daughter, Tammy Oneill in [place] had breast cancer. Daughter,Tammy Oneill is also my patient and works at [employer] here in [place]. Daughter, Tammy Oneill, works in Engineer, technical sales in [place], [place].  The patient has nine grandchildren and three great-grandchildren.  She attends [employer].      ADVANCED DIRECTIVES:  HEALTH MAINTENANCE: Social History  Substance Use Topics  . Smoking status: Never Smoker  . Smokeless tobacco: Never Used  . Alcohol use No     Colonoscopy:  PAP:  Bone density:  Lipid panel:  Allergies  Allergen Reactions  . Morphine And Related     vomiting    Current Outpatient Prescriptions  Medication Sig Dispense Refill  . aspirin 81 MG tablet Take 81 mg by mouth  daily.     Marland Kitchen atorvastatin (LIPITOR) 10 MG tablet Take 10 mg by mouth daily.  6  . Cyanocobalamin (VITAMIN B 12 PO) Take 2 tablets by mouth daily. 2 gummies  Daily in am    . metoprolol succinate (TOPROL-XL) 25 MG 24 hr tablet Take 1/2 tablet daily 45 tablet 3  . tamoxifen (NOLVADEX) 20 MG tablet Take 1 tablet (20 mg total) by mouth daily. 90 tablet 3  . telmisartan-hydrochlorothiazide (MICARDIS HCT) 80-12.5 MG tablet TAKE 1 TABLET BY MOUTH EVERY DAY 30 tablet 0   No current facility-administered medications for this visit.     OBJECTIVE: Elderly white woman In no acute distress Vitals:   06/14/16 0929  BP: 137/61  Pulse: (!) 52  Resp: 17  Temp: 98 F (36.7 C)     Body mass index is 24.29 kg/m.    ECOG FS: 1  Sclerae unicteric, EOMs intact Oropharynx clear and moist No cervical or supraclavicular adenopathy Lungs no rales or rhonchi Heart regular rate and rhythm Abd soft, nontender, positive bowel sounds MSK no focal spinal tenderness, no upper extremity lymphedema Neuro: nonfocal, well oriented, appropriate affect Breasts:  Status post bilateral lumpectomies with radiation. There is no evidence of chest wall recurrence. Both axillae are benign.  RESULTS: Lab Results  Component Value Date   WBC 3.5 (L) 06/14/2016   NEUTROABS 2.0 06/14/2016   HGB 11.4 (L) 06/14/2016   HCT 32.9 (L) 06/14/2016   MCV 97.1 06/14/2016   PLT 135 (L) 06/14/2016      Chemistry      Component Value Date/Time   NA 139 06/14/2016 0855   K 4.1 06/14/2016 0855   CL 101 03/18/2012 1433   CO2 26 06/14/2016 0855   BUN 17.0 06/14/2016 0855   CREATININE 1.0 06/14/2016 0855      Component Value Date/Time   CALCIUM 9.3 06/14/2016 0855   ALKPHOS 61 06/14/2016 0855   AST 14 06/14/2016 0855   ALT 13 06/14/2016 0855   BILITOT 0.55 06/14/2016 0855       Lab Results  Component Value Date   LABCA2 21 03/18/2012    No components found for: [id number]  No results for input(s): INR in the last 168  hours.  Urinalysis No results found for: COLORURINE  STUDIES: Mammography at [facility] a week ago was unremarkable  ASSESSMENT: 82 y.o. BRCA negative [place] woman  (1) status post double right-sided lumpectomies August 2011 for a T1c and T1b N0, stage IA invasive ductal carcinomasInvolving overlapping Oneill effects, both strongly estrogen and progesterone receptor positive, HER2/neu negative, with low proliferation fractions.   (2) completed radiation therapy October 2011  (3) refused adjuvant antiestrogens.   (4) left upper inner quadrant lumpectomy 06/09/15 for DCIS, ER/PR positive, HER2/neu negative, positive margins   (5) adjuvant radiation 08/04/2015-09/02/2015 Left breast 42.72 gray in 16 fractions; lumpectomy cavity boost 10 gray in 5 fractions, cumulative dose 52.72 gray to the lumpectomy cavity  (6) started tamoxifen 09/18/2015  PLAN:  [place]ora is tolerating tamoxifen well and the plan will be to continue that for a total of 5 years.  Her labs were drawn by student today and I do believe not enough blood was put into the tube which explains why all her counts are little bit on the low Oneill, with no change in her MCV. This is simply lab error. I have discussed that with the lab.   She is going to return to see me in one year, after her next mammogram. She knows to call for any problems that may develop before her next visit.  MAGRINAT,GUSTAV C    06/14/2016

## 2016-06-18 DIAGNOSIS — I10 Essential (primary) hypertension: Secondary | ICD-10-CM | POA: Diagnosis not present

## 2016-06-18 DIAGNOSIS — Z0000 Encounter for general adult medical examination without abnormal findings: Secondary | ICD-10-CM | POA: Diagnosis not present

## 2016-06-18 DIAGNOSIS — K219 Gastro-esophageal reflux disease without esophagitis: Secondary | ICD-10-CM | POA: Diagnosis not present

## 2016-06-18 DIAGNOSIS — E782 Mixed hyperlipidemia: Secondary | ICD-10-CM | POA: Diagnosis not present

## 2016-06-18 DIAGNOSIS — Z23 Encounter for immunization: Secondary | ICD-10-CM | POA: Diagnosis not present

## 2016-06-25 DIAGNOSIS — E782 Mixed hyperlipidemia: Secondary | ICD-10-CM | POA: Diagnosis not present

## 2016-06-25 DIAGNOSIS — K219 Gastro-esophageal reflux disease without esophagitis: Secondary | ICD-10-CM | POA: Diagnosis not present

## 2016-06-25 DIAGNOSIS — G2581 Restless legs syndrome: Secondary | ICD-10-CM | POA: Diagnosis not present

## 2016-06-25 DIAGNOSIS — I10 Essential (primary) hypertension: Secondary | ICD-10-CM | POA: Diagnosis not present

## 2016-08-07 DIAGNOSIS — C50419 Malignant neoplasm of upper-outer quadrant of unspecified female breast: Secondary | ICD-10-CM | POA: Diagnosis not present

## 2016-11-28 DIAGNOSIS — H01025 Squamous blepharitis left lower eyelid: Secondary | ICD-10-CM | POA: Diagnosis not present

## 2016-11-28 DIAGNOSIS — H16143 Punctate keratitis, bilateral: Secondary | ICD-10-CM | POA: Diagnosis not present

## 2016-11-28 DIAGNOSIS — H04123 Dry eye syndrome of bilateral lacrimal glands: Secondary | ICD-10-CM | POA: Diagnosis not present

## 2016-11-28 DIAGNOSIS — H26493 Other secondary cataract, bilateral: Secondary | ICD-10-CM | POA: Diagnosis not present

## 2016-11-28 DIAGNOSIS — Z961 Presence of intraocular lens: Secondary | ICD-10-CM | POA: Diagnosis not present

## 2016-11-28 DIAGNOSIS — H01022 Squamous blepharitis right lower eyelid: Secondary | ICD-10-CM | POA: Diagnosis not present

## 2016-11-28 DIAGNOSIS — H01024 Squamous blepharitis left upper eyelid: Secondary | ICD-10-CM | POA: Diagnosis not present

## 2016-11-28 DIAGNOSIS — H01021 Squamous blepharitis right upper eyelid: Secondary | ICD-10-CM | POA: Diagnosis not present

## 2016-12-06 DIAGNOSIS — Z961 Presence of intraocular lens: Secondary | ICD-10-CM | POA: Diagnosis not present

## 2016-12-06 DIAGNOSIS — H26491 Other secondary cataract, right eye: Secondary | ICD-10-CM | POA: Diagnosis not present

## 2017-04-02 ENCOUNTER — Telehealth: Payer: Self-pay

## 2017-04-02 NOTE — Telephone Encounter (Signed)
Called and left a message with a new appt due to dr gm out of the office  Tammy Oneill

## 2017-06-13 DIAGNOSIS — R922 Inconclusive mammogram: Secondary | ICD-10-CM | POA: Diagnosis not present

## 2017-06-13 DIAGNOSIS — R921 Mammographic calcification found on diagnostic imaging of breast: Secondary | ICD-10-CM | POA: Diagnosis not present

## 2017-06-13 DIAGNOSIS — Z853 Personal history of malignant neoplasm of breast: Secondary | ICD-10-CM | POA: Diagnosis not present

## 2017-06-17 ENCOUNTER — Ambulatory Visit: Payer: No Typology Code available for payment source | Admitting: Oncology

## 2017-06-17 ENCOUNTER — Other Ambulatory Visit: Payer: No Typology Code available for payment source

## 2017-07-08 NOTE — Progress Notes (Signed)
ID: Su Monks   DOB: 01-May-1933  MR#: [id number]  QVZ#:[id number]  CHIEF COMPLAINT:   CURRENT TREATMENT:tamoxifen  BREAST CANCER HISTORY: From the original intake note:   She was feeling fine, and had no particular symptoms when she had routine mammographic screening at [facility] March 02, 2010.  Her prior mammogram had been in 2008.  This time Dr. Isaiah Blakes noted an irregular area of focal asymmetry in the upper outer quadrant of the right breast, and the patient was brought back for a diagnostic mammography and right breast ultrasonography March 13, 2010.  This confirmed an irregular ill defined, mixed density/hyperdense mass in the right breast with some spiculation and distortion, which by ultrasound measured 1.2 cm.  There was also a smaller, though otherwise similar lesion more posteriorly, and that one measured 8 mm by ultrasound.  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  Both were estrogen and progesterone receptor positive and HER2-, and both had a low proliferation fraction, although morphologically there was some difference between the masses, so that at the Multidisciplinary Breast Cancer Conference this morning, it was felt most likely this were two concurrent primaries.  With this information the patient was referred to Dr. Donne Hazel, and bilateral breast MRIs were obtained January 7th.  This showed the larger mass to be 1.4 cm, the smaller one to measure 1.1 cm, but there was no other finding of significance in either breast, and there was no evidence of adenopathy.   Accordingly, the patient proceeded to definitive lumpectomies, which were performed August 17.  The final pathology from this procedure ([id number]) showed two invasive ductal carcinomas, the larger measuring 1.4 cm, the smaller 7 mm, again both ER/PR+, and HER2-.  Both were grade 1.  Margins were close, although negative,  and there was no evidence of lymphovascular invasion.  Zero of two sentinel lymph nodes were involved.  The patient is staged at T1c N0, or stage I.  Her subsequent history is as detailed below.  INTERVAL HISTORY: Tammy Oneill returns today for follow-up and treatment of her estrogen receptor positive breast cancer. She is doing well overall. She continues on tamoxifen, with great tolerance. She has mild hot flashes as well as mild increase in vaginal discharge.     REVIEW OF SYSTEMS: Tammy Oneill reports that she mows her yard with a push mower as her form of exercise approximately once a week. She has lost interest in walking as her form as exercise, but is interested in walking programs that are available. She reports decreased energy levels. She denies unusual headaches, visual changes, nausea, vomiting, or dizziness. There has been no unusual cough, phlegm production, or pleurisy. This been no change in bowel or bladder habits. She denies unexplained fatigue or unexplained weight loss, bleeding, rash, or fever. A detailed review of systems was otherwise stable.    PAST MEDICAL HISTORY: Past Medical History:  Diagnosis Date  . Anxiety   . Bilateral breast cancer ([place])   . GERD (gastroesophageal reflux disease)   . Hyperlipidemia   . Hypertension   . Osteopenia    on actonel  . PONV (postoperative nausea and vomiting)   . Radiation 08/04/15-09/02/15   left breast 42.72 gray, boosted o 52.72 gray  The past medical history is significant for osteopenia, history of cervical cancer status post simple hysterectomy without salpingo oophorectomy at the age of 76, history of osteoarthritis especially involving the right shoulder where she  says she has a fairly chronic bursitis, hypercholesterolemia, history of frequent urinary tract infections, history of appendectomy, history of cataracts, not yet surgically removed (she tells me Dr. Katy Fitch is planning to do this in January), and history of "an enlarged  heart" although this was not seen in the most recent chest x-ray.    PAST SURGICAL HISTORY: Past Surgical History:  Procedure Laterality Date  . ABDOMINAL HYSTERECTOMY    . APPENDECTOMY    . BREAST LUMPECTOMY WITH RADIOACTIVE SEED LOCALIZATION Left 06/09/2015   Procedure: BREAST LUMPECTOMY WITH RADIOACTIVE SEED LOCALIZATION;  Surgeon: Rolm Bookbinder, MD;  Location: [facility];  Service: General;  Laterality: Left;  . BREAST SURGERY Right 2011   lumpectomy Dr Donne Hazel    FAMILY HISTORY The patient's mother died with bladder cancer at the age of 41.  The patient's father died with lung cancer at the age of 33.  She had one brother who died in an automobile accident. One sister is "okay".  There is no breast or ovarian cancer in the family except for the patient's older daughter, Tammy Oneill, who lives in [place], and was diagnosed with breast cancer at the age of 45. She was [facility] at [facility].   GYNECOLOGIC HISTORY: GX P5.  First pregnancy to term at age 15.  Hysterectomy at age 69.  She took Premarin for many years, stopping about ten years ago.   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  She has been divorced twice, lives home alone, has no pets.  Son, Tammy Oneill, works in a Psychologist, prison and probation services company here in [place], as does son, Tammy Oneill..  Daughter, Tammy Oneill in [place] had breast cancer. Daughter,Tammy Oneill is also my patient and works at [employer] here in [place]. Daughter, Tammy Oneill, works in Engineer, technical sales in [place], [place].  The patient has nine grandchildren and three great-grandchildren.  She attends [employer].      ADVANCED DIRECTIVES:  HEALTH MAINTENANCE: Social History  Substance Use Topics  . Smoking status: Never Smoker  . Smokeless tobacco: Never Used  . Alcohol use No     Colonoscopy:  PAP:  Bone  density:  Lipid panel:  Allergies  Allergen Reactions  . Morphine And Related     vomiting    Current Outpatient Prescriptions  Medication Sig Dispense Refill  . aspirin 81 MG tablet Take 81 mg by mouth daily.     Marland Kitchen atorvastatin (LIPITOR) 10 MG tablet Take 10 mg by mouth daily.  6  . metoprolol succinate (TOPROL-XL) 25 MG 24 hr tablet Take 1/2 tablet daily 45 tablet 3  . tamoxifen (NOLVADEX) 20 MG tablet Take 1 tablet (20 mg total) by mouth daily. 90 tablet 3  . telmisartan-hydrochlorothiazide (MICARDIS HCT) 80-12.5 MG tablet TAKE 1 TABLET BY MOUTH EVERY DAY 30 tablet 0   No current facility-administered medications for this visit.     OBJECTIVE: Elderly white woman   Vitals:   07/10/17 0934 07/10/17 0944  BP: (!) 163/44 (!) 163/44  Pulse: (!) 56 (!) 56  Resp: 18 18  Temp: 98 F (36.7 C) 98 F (36.7 C)  SpO2: 99% 99%     Body mass index is 25.37 kg/m.    ECOG FS: 1  Sclerae unicteric, pupils round and equal Oropharynx clear and moist No cervical or supraclavicular adenopathy Lungs no rales or rhonchi Heart regular rate  and rhythm Abd soft, nontender, positive bowel sounds MSK no focal spinal tenderness, no upper extremity lymphedema Neuro: nonfocal, well oriented, appropriate affect Breasts: both breasts are s/p lumpectomy and radiation. There is noe vidence of local recurrence. Both axillae are benign   .  RESULTS: Lab Results  Component Value Date   WBC 3.9 07/10/2017   NEUTROABS 2.1 07/10/2017   HGB 11.9 07/10/2017   HCT 34.5 (L) 07/10/2017   MCV 99.1 07/10/2017   PLT 145 07/10/2017      Chemistry      Component Value Date/Time   NA 140 07/10/2017 0910   K 4.3 07/10/2017 0910   CL 101 03/18/2012 1433   CO2 26 07/10/2017 0910   BUN 13.5 07/10/2017 0910   CREATININE 1.0 07/10/2017 0910      Component Value Date/Time   CALCIUM 9.4 07/10/2017 0910   ALKPHOS 75 07/10/2017 0910   AST 17 07/10/2017 0910   ALT 12 07/10/2017 0910   BILITOT 0.51  07/10/2017 0910       Lab Results  Component Value Date   LABCA2 21 03/18/2012    No components found for: [id number]  No results for input(s): INR in the last 168 hours.  Urinalysis No results found for: COLORURINE  STUDIES: Mammography at [facility] earlier this month showed no evidence of malignancy per report  ASSESSMENT: 83 y.o. BRCA negative [place] woman  (1) status post double right-sided lumpectomies August 2011 for a T1c and T1b N0, stage IA invasive ductal carcinomasInvolving overlapping Oneill effects, both strongly estrogen and progesterone receptor positive, HER2/neu negative, with low proliferation fractions.   (2) completed radiation therapy October 2011  (3) refused adjuvant antiestrogens.   (4) left upper inner quadrant lumpectomy 06/09/15 for DCIS, ER/PR positive, HER2/neu negative, positive margins   (5) adjuvant radiation 08/04/2015-09/02/2015 Left breast 42.72 gray in 16 fractions; lumpectomy cavity boost 10 gray in 5 fractions, cumulative dose 52.72 gray to the lumpectomy cavity  (6) started tamoxifen 09/18/2015  PLAN:  Jammie is now a little more than 2 years out from definitive surgery for her more recent breast cance, with no evidence of disease recurrence. This is very favorable.  She is tolerating the tamoxifen well. The plan will be for 5 years on this drug, after which she will (again) "graduate from follow up here.  She will see me again in one year. She knows to call for any problem that may develop before her next visit.  Magrinat, Virgie Dad, MD  07/10/17 10:01 AM Medical Oncology and Hematology [facility] [address] [place], [place] [address] Tel. [phone]    Fax. [phone]  This document serves as a record of services personally performed by Lurline Del, MD. It was created on her behalf by Steva Colder, a trained medical scribe. The creation of this record is based on the scribe's personal observations and the  provider's statements to them. This document has been checked and approved by the attending provider.

## 2017-07-09 ENCOUNTER — Other Ambulatory Visit: Payer: Self-pay | Admitting: *Deleted

## 2017-07-09 DIAGNOSIS — E782 Mixed hyperlipidemia: Secondary | ICD-10-CM | POA: Diagnosis not present

## 2017-07-09 DIAGNOSIS — K219 Gastro-esophageal reflux disease without esophagitis: Secondary | ICD-10-CM | POA: Diagnosis not present

## 2017-07-09 DIAGNOSIS — Z23 Encounter for immunization: Secondary | ICD-10-CM | POA: Diagnosis not present

## 2017-07-09 DIAGNOSIS — C50811 Malignant neoplasm of overlapping sites of right female breast: Secondary | ICD-10-CM

## 2017-07-09 DIAGNOSIS — M810 Age-related osteoporosis without current pathological fracture: Secondary | ICD-10-CM | POA: Diagnosis not present

## 2017-07-09 DIAGNOSIS — G2581 Restless legs syndrome: Secondary | ICD-10-CM | POA: Diagnosis not present

## 2017-07-09 DIAGNOSIS — I10 Essential (primary) hypertension: Secondary | ICD-10-CM | POA: Diagnosis not present

## 2017-07-09 DIAGNOSIS — Z0000 Encounter for general adult medical examination without abnormal findings: Secondary | ICD-10-CM | POA: Diagnosis not present

## 2017-07-10 ENCOUNTER — Telehealth: Payer: Self-pay | Admitting: Oncology

## 2017-07-10 ENCOUNTER — Ambulatory Visit (HOSPITAL_BASED_OUTPATIENT_CLINIC_OR_DEPARTMENT_OTHER): Payer: Medicare Other | Admitting: Oncology

## 2017-07-10 ENCOUNTER — Other Ambulatory Visit (HOSPITAL_BASED_OUTPATIENT_CLINIC_OR_DEPARTMENT_OTHER): Payer: Medicare Other

## 2017-07-10 VITALS — BP 163/44 | HR 56 | Temp 98.0°F | Resp 18 | Ht 62.0 in | Wt 138.7 lb

## 2017-07-10 DIAGNOSIS — Z79810 Long term (current) use of selective estrogen receptor modulators (SERMs): Secondary | ICD-10-CM

## 2017-07-10 DIAGNOSIS — C50811 Malignant neoplasm of overlapping sites of right female breast: Secondary | ICD-10-CM

## 2017-07-10 DIAGNOSIS — Z170 Estrogen receptor positive status [ER+]: Secondary | ICD-10-CM | POA: Diagnosis not present

## 2017-07-10 DIAGNOSIS — Z853 Personal history of malignant neoplasm of breast: Secondary | ICD-10-CM

## 2017-07-10 DIAGNOSIS — C50212 Malignant neoplasm of upper-inner quadrant of left female breast: Secondary | ICD-10-CM

## 2017-07-10 DIAGNOSIS — M8588 Other specified disorders of bone density and structure, other site: Secondary | ICD-10-CM

## 2017-07-10 LAB — CBC WITH DIFFERENTIAL/PLATELET
BASO%: 1 % (ref 0.0–2.0)
Basophils Absolute: 0 10*3/uL (ref 0.0–0.1)
EOS%: 3 % (ref 0.0–7.0)
Eosinophils Absolute: 0.1 10*3/uL (ref 0.0–0.5)
HEMATOCRIT: 34.5 % — AB (ref 34.8–46.6)
HGB: 11.9 g/dL (ref 11.6–15.9)
LYMPH#: 1.2 10*3/uL (ref 0.9–3.3)
LYMPH%: 31.5 % (ref 14.0–49.7)
MCH: 34.1 pg — ABNORMAL HIGH (ref 25.1–34.0)
MCHC: 34.4 g/dL (ref 31.5–36.0)
MCV: 99.1 fL (ref 79.5–101.0)
MONO#: 0.4 10*3/uL (ref 0.1–0.9)
MONO%: 10.5 % (ref 0.0–14.0)
NEUT%: 54 % (ref 38.4–76.8)
NEUTROS ABS: 2.1 10*3/uL (ref 1.5–6.5)
PLATELETS: 145 10*3/uL (ref 145–400)
RBC: 3.49 10*6/uL — ABNORMAL LOW (ref 3.70–5.45)
RDW: 12.1 % (ref 11.2–14.5)
WBC: 3.9 10*3/uL (ref 3.9–10.3)

## 2017-07-10 LAB — COMPREHENSIVE METABOLIC PANEL
ALT: 12 U/L (ref 0–55)
ANION GAP: 8 meq/L (ref 3–11)
AST: 17 U/L (ref 5–34)
Albumin: 3.7 g/dL (ref 3.5–5.0)
Alkaline Phosphatase: 75 U/L (ref 40–150)
BILIRUBIN TOTAL: 0.51 mg/dL (ref 0.20–1.20)
BUN: 13.5 mg/dL (ref 7.0–26.0)
CALCIUM: 9.4 mg/dL (ref 8.4–10.4)
CHLORIDE: 106 meq/L (ref 98–109)
CO2: 26 mEq/L (ref 22–29)
Creatinine: 1 mg/dL (ref 0.6–1.1)
EGFR: 50 mL/min/{1.73_m2} — AB (ref >60)
Glucose: 91 mg/dl (ref 70–140)
Potassium: 4.3 mEq/L (ref 3.5–5.1)
Sodium: 140 mEq/L (ref 136–145)
TOTAL PROTEIN: 6.7 g/dL (ref 6.4–8.3)

## 2017-07-10 NOTE — Telephone Encounter (Signed)
Gave patient avs and calendar with appts per 10/24 los.  °

## 2017-07-16 DIAGNOSIS — C50219 Malignant neoplasm of upper-inner quadrant of unspecified female breast: Secondary | ICD-10-CM | POA: Diagnosis not present

## 2017-07-16 DIAGNOSIS — M810 Age-related osteoporosis without current pathological fracture: Secondary | ICD-10-CM | POA: Diagnosis not present

## 2017-07-16 DIAGNOSIS — G2581 Restless legs syndrome: Secondary | ICD-10-CM | POA: Diagnosis not present

## 2017-07-16 DIAGNOSIS — M150 Primary generalized (osteo)arthritis: Secondary | ICD-10-CM | POA: Diagnosis not present

## 2017-07-16 DIAGNOSIS — E782 Mixed hyperlipidemia: Secondary | ICD-10-CM | POA: Diagnosis not present

## 2017-07-16 DIAGNOSIS — I10 Essential (primary) hypertension: Secondary | ICD-10-CM | POA: Diagnosis not present

## 2017-07-16 DIAGNOSIS — K219 Gastro-esophageal reflux disease without esophagitis: Secondary | ICD-10-CM | POA: Diagnosis not present

## 2017-08-28 ENCOUNTER — Other Ambulatory Visit: Payer: Self-pay | Admitting: *Deleted

## 2017-08-28 MED ORDER — TAMOXIFEN CITRATE 20 MG PO TABS: 20.0000 mg | ORAL_TABLET | Freq: Every day | ORAL | 3 refills | Status: DC

## 2017-09-25 DIAGNOSIS — M810 Age-related osteoporosis without current pathological fracture: Secondary | ICD-10-CM | POA: Diagnosis not present

## 2018-03-27 DIAGNOSIS — M810 Age-related osteoporosis without current pathological fracture: Secondary | ICD-10-CM | POA: Diagnosis not present

## 2018-05-30 ENCOUNTER — Encounter: Payer: Self-pay | Admitting: Licensed Clinical Social Worker

## 2018-06-24 ENCOUNTER — Encounter: Payer: Self-pay | Admitting: Oncology

## 2018-06-24 DIAGNOSIS — Z853 Personal history of malignant neoplasm of breast: Secondary | ICD-10-CM | POA: Diagnosis not present

## 2018-06-24 DIAGNOSIS — R922 Inconclusive mammogram: Secondary | ICD-10-CM | POA: Diagnosis not present

## 2018-07-15 ENCOUNTER — Other Ambulatory Visit: Payer: Self-pay

## 2018-07-15 DIAGNOSIS — L570 Actinic keratosis: Secondary | ICD-10-CM | POA: Diagnosis not present

## 2018-07-15 DIAGNOSIS — D0462 Carcinoma in situ of skin of left upper limb, including shoulder: Secondary | ICD-10-CM | POA: Diagnosis not present

## 2018-07-15 DIAGNOSIS — X32XXXA Exposure to sunlight, initial encounter: Secondary | ICD-10-CM | POA: Diagnosis not present

## 2018-07-15 DIAGNOSIS — D225 Melanocytic nevi of trunk: Secondary | ICD-10-CM | POA: Diagnosis not present

## 2018-07-15 DIAGNOSIS — C50212 Malignant neoplasm of upper-inner quadrant of left female breast: Secondary | ICD-10-CM

## 2018-07-15 DIAGNOSIS — L821 Other seborrheic keratosis: Secondary | ICD-10-CM | POA: Diagnosis not present

## 2018-07-15 DIAGNOSIS — Z170 Estrogen receptor positive status [ER+]: Principal | ICD-10-CM

## 2018-07-16 ENCOUNTER — Inpatient Hospital Stay: Payer: Medicare Other | Attending: Oncology | Admitting: Oncology

## 2018-07-16 ENCOUNTER — Inpatient Hospital Stay: Payer: Medicare Other

## 2018-07-16 ENCOUNTER — Telehealth: Payer: Self-pay | Admitting: Oncology

## 2018-07-16 VITALS — BP 155/58 | HR 62 | Temp 98.2°F | Resp 18 | Ht 62.0 in | Wt 139.3 lb

## 2018-07-16 DIAGNOSIS — Z803 Family history of malignant neoplasm of breast: Secondary | ICD-10-CM | POA: Insufficient documentation

## 2018-07-16 DIAGNOSIS — Z7982 Long term (current) use of aspirin: Secondary | ICD-10-CM | POA: Diagnosis not present

## 2018-07-16 DIAGNOSIS — Z79899 Other long term (current) drug therapy: Secondary | ICD-10-CM | POA: Insufficient documentation

## 2018-07-16 DIAGNOSIS — Z90710 Acquired absence of both cervix and uterus: Secondary | ICD-10-CM

## 2018-07-16 DIAGNOSIS — Z923 Personal history of irradiation: Secondary | ICD-10-CM | POA: Diagnosis not present

## 2018-07-16 DIAGNOSIS — C50811 Malignant neoplasm of overlapping sites of right female breast: Secondary | ICD-10-CM

## 2018-07-16 DIAGNOSIS — C50212 Malignant neoplasm of upper-inner quadrant of left female breast: Secondary | ICD-10-CM

## 2018-07-16 DIAGNOSIS — Z79810 Long term (current) use of selective estrogen receptor modulators (SERMs): Secondary | ICD-10-CM | POA: Diagnosis not present

## 2018-07-16 DIAGNOSIS — Z170 Estrogen receptor positive status [ER+]: Secondary | ICD-10-CM | POA: Diagnosis not present

## 2018-07-16 DIAGNOSIS — C50411 Malignant neoplasm of upper-outer quadrant of right female breast: Secondary | ICD-10-CM | POA: Insufficient documentation

## 2018-07-16 LAB — CMP (CANCER CENTER ONLY)
ALBUMIN: 3.6 g/dL (ref 3.5–5.0)
ALK PHOS: 74 U/L (ref 38–126)
ALT: 13 U/L (ref 0–44)
ANION GAP: 7 (ref 5–15)
AST: 16 U/L (ref 15–41)
BUN: 14 mg/dL (ref 8–23)
CALCIUM: 9.3 mg/dL (ref 8.9–10.3)
CO2: 29 mmol/L (ref 22–32)
Chloride: 103 mmol/L (ref 98–111)
Creatinine: 1.12 mg/dL — ABNORMAL HIGH (ref 0.44–1.00)
GFR, Est AFR Am: 51 mL/min — ABNORMAL LOW (ref >60)
GFR, Estimated: 44 mL/min — ABNORMAL LOW (ref >60)
GLUCOSE: 107 mg/dL — AB (ref 70–99)
POTASSIUM: 3.8 mmol/L (ref 3.5–5.1)
SODIUM: 139 mmol/L (ref 135–145)
Total Bilirubin: 0.5 mg/dL (ref 0.3–1.2)
Total Protein: 6.7 g/dL (ref 6.5–8.1)

## 2018-07-16 LAB — CBC WITH DIFFERENTIAL (CANCER CENTER ONLY)
ABS IMMATURE GRANULOCYTES: 0.01 10*3/uL (ref 0.00–0.07)
Basophils Absolute: 0 10*3/uL (ref 0.0–0.1)
Basophils Relative: 1 %
Eosinophils Absolute: 0.2 10*3/uL (ref 0.0–0.5)
Eosinophils Relative: 3 %
HEMATOCRIT: 35.2 % — AB (ref 36.0–46.0)
HEMOGLOBIN: 11.9 g/dL — AB (ref 12.0–15.0)
IMMATURE GRANULOCYTES: 0 %
LYMPHS ABS: 2 10*3/uL (ref 0.7–4.0)
Lymphocytes Relative: 33 %
MCH: 33.1 pg (ref 26.0–34.0)
MCHC: 33.8 g/dL (ref 30.0–36.0)
MCV: 98.1 fL (ref 80.0–100.0)
MONO ABS: 0.5 10*3/uL (ref 0.1–1.0)
Monocytes Relative: 8 %
NEUTROS ABS: 3.3 10*3/uL (ref 1.7–7.7)
NEUTROS PCT: 55 %
Platelet Count: 159 10*3/uL (ref 150–400)
RBC: 3.59 MIL/uL — ABNORMAL LOW (ref 3.87–5.11)
RDW: 11.9 % (ref 11.5–15.5)
WBC Count: 5.9 10*3/uL (ref 4.0–10.5)
nRBC: 0 % (ref 0.0–0.2)

## 2018-07-16 MED ORDER — TAMOXIFEN CITRATE 20 MG PO TABS: 20.0000 mg | ORAL_TABLET | Freq: Every day | ORAL | 3 refills | Status: DC

## 2018-07-16 NOTE — Telephone Encounter (Signed)
Gave patient avs and calendar.   °

## 2018-07-16 NOTE — Progress Notes (Addendum)
ID: Su Monks   DOB: [birth year]  MR#: [id number]  XBL#:[id number]  CHIEF COMPLAINT: Estrogen receptor positive breast cancer  CURRENT TREATMENT: tamoxifen  BREAST CANCER HISTORY: From the original intake note:   She was feeling fine, and had no particular symptoms when she had routine mammographic screening at [facility] March 02, 2010.  Her prior mammogram had been in 2008.  This time Dr. Isaiah Oneill noted an irregular area of focal asymmetry in the upper outer quadrant of the right breast, and the patient was brought back for a diagnostic mammography and right breast ultrasonography March 13, 2010.  This confirmed an irregular ill defined, mixed density/hyperdense mass in the right breast with some spiculation and distortion, which by ultrasound measured 1.2 cm.  There was also a smaller, though otherwise similar lesion more posteriorly, and that one measured 8 mm by ultrasound.  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  Both were estrogen and progesterone receptor positive and HER2-, and both had a low proliferation fraction, although morphologically there was some difference between the masses, so that at the Multidisciplinary Breast Cancer Conference this morning, it was felt most likely this were two concurrent primaries.  With this information the patient was referred to Dr. Donne Oneill, and bilateral breast MRIs were obtained January 7th.  This showed the larger mass to be 1.4 cm, the smaller one to measure 1.1 cm, but there was no other finding of significance in either breast, and there was no evidence of adenopathy.   Accordingly, the patient proceeded to definitive lumpectomies, which were performed August 17.  The final pathology from this procedure ([id number]) showed two invasive ductal carcinomas, the larger measuring 1.4 cm, the smaller 7 mm, again both ER/PR+, and HER2-.  Both were grade  1.  Margins were close, although negative, and there was no evidence of lymphovascular invasion.  Zero of two sentinel lymph nodes were involved.  The patient is staged at T1c N0, or stage I.  Her subsequent history is as detailed below.  INTERVAL HISTORY: Tammy Oneill returns today for follow-up and treatment of her estrogen receptor positive breast cancer. She is doing well overall. She continues on tamoxifen, with good tolerance. She has rare hot flashes as well as very minimal vaginal discharge.   Since her last visit, she underwent mammography with benign results.  We are obtaining the actual report as I dictate   REVIEW OF SYSTEMS: Tammy Oneill reports not doing much for exercise. She enjoys walking, but she doesn't walk by herself. The patient denies unusual headaches, visual changes, nausea, vomiting, stiff neck, dizziness, or gait imbalance. There has been no cough, phlegm production, or pleurisy, no chest pain or pressure, and no change in bowel or bladder habits. The patient denies fever, rash, bleeding, unexplained fatigue or unexplained weight loss. A detailed review of systems was otherwise entirely negative.   PAST MEDICAL HISTORY: Past Medical History:  Diagnosis Date  . Anxiety   . Bilateral breast cancer ([place])   . GERD (gastroesophageal reflux disease)   . Hyperlipidemia   . Hypertension   . Osteopenia    on actonel  . PONV (postoperative nausea and vomiting)   . Radiation 08/04/15-09/02/15   left breast 42.72 gray, boosted o 52.72 gray  The past medical history is significant for osteopenia, history of cervical cancer status post simple hysterectomy without salpingo oophorectomy at the age of 66, history of osteoarthritis especially involving the right  shoulder where she says she has a fairly chronic bursitis, hypercholesterolemia, history of frequent urinary tract infections, history of appendectomy, history of cataracts, not yet surgically removed (she tells me Dr. Katy Fitch is  planning to do this in January), and history of "an enlarged heart" although this was not seen in the most recent chest x-ray.    PAST SURGICAL HISTORY: Past Surgical History:  Procedure Laterality Date  . ABDOMINAL HYSTERECTOMY    . APPENDECTOMY    . BREAST LUMPECTOMY WITH RADIOACTIVE SEED LOCALIZATION Left 06/09/2015   Procedure: BREAST LUMPECTOMY WITH RADIOACTIVE SEED LOCALIZATION;  Surgeon: Rolm Bookbinder, MD;  Location: [facility];  Service: General;  Laterality: Left;  . BREAST SURGERY Right 2011   lumpectomy Dr Tammy Oneill    FAMILY HISTORY The patient's mother died with bladder cancer at the age of 67.  The patient's father died with lung cancer at the age of 49.  She had one brother who died in an automobile accident. One sister is "okay".  There is no breast or ovarian cancer in the family except for the patient's older daughter, Tammy Oneill, who lives in [place], and was diagnosed with breast cancer at the age of 70. She was treated at [facility].   GYNECOLOGIC HISTORY: GX P5.  First pregnancy to term at age 22.  Hysterectomy at age 61.  She took Premarin for many years, stopping about ten years ago.   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  She has been divorced twice, lives home alone, has no pets.  Son, Tammy Oneill, works in a Psychologist, prison and probation services company here in [place], as does son, Tammy Oneill..  Daughter, Tammy Oneill in [place] had breast cancer. Daughter,Tammy Oneill is also my patient and works at [employer] here in [place]. Daughter, Tammy Oneill, works in Engineer, technical sales in [place], [place].  The patient has nine grandchildren and three great-grandchildren.  She attends [employer].      ADVANCED DIRECTIVES:  HEALTH MAINTENANCE: Social History   Tobacco Use  . Smoking status: Never Smoker  . Smokeless tobacco: Never Used   Substance Use Topics  . Alcohol use: No  . Drug use: No     Colonoscopy:  PAP:  Bone density:  Lipid panel:  Allergies  Allergen Reactions  . Morphine And Related     vomiting    Current Outpatient Medications  Medication Sig Dispense Refill  . aspirin 81 MG tablet Take 81 mg by mouth daily.     Marland Kitchen atorvastatin (LIPITOR) 10 MG tablet Take 10 mg by mouth daily.  6  . metoprolol succinate (TOPROL-XL) 25 MG 24 hr tablet Take 1/2 tablet daily 45 tablet 3  . multivitamin-iron-minerals-folic acid (CENTRUM) chewable tablet Chew 1 tablet by mouth daily.    . naproxen sodium (ALEVE) 220 MG tablet Take 1 tablet (220 mg total) by mouth daily as needed.    . tamoxifen (NOLVADEX) 20 MG tablet Take 1 tablet (20 mg total) by mouth daily. 90 tablet 3  . telmisartan-hydrochlorothiazide (MICARDIS HCT) 80-12.5 MG tablet TAKE 1 TABLET BY MOUTH EVERY DAY 30 tablet 0  . vitamin B-12 (CYANOCOBALAMIN) 500 MCG tablet Take 1 tablet (500 mcg total) by mouth daily.     No current facility-administered medications for this visit.     OBJECTIVE: Elderly white woman who appears well  Vitals:   07/16/18 1304  BP: Marland Kitchen)  155/58  Pulse: 62  Resp: 18  Temp: 98.2 F (36.8 C)  SpO2: 98%     Body mass index is 25.48 kg/m.    ECOG FS: 1  Sclerae unicteric, EOMs intact No cervical or supraclavicular adenopathy Lungs no rales or rhonchi Heart regular rate and rhythm Abd soft, nontender, positive bowel sounds MSK no focal spinal tenderness, no upper extremity lymphedema Neuro: nonfocal, well oriented, appropriate affect Breasts: I do not palpate any suspicious masses in either breast, both of which are status post lumpectomy.  Both axillae are benign.  RESULTS: Lab Results  Component Value Date   WBC 5.9 07/16/2018   NEUTROABS 3.3 07/16/2018   HGB 11.9 (L) 07/16/2018   HCT 35.2 (L) 07/16/2018   MCV 98.1 07/16/2018   PLT 159 07/16/2018      Chemistry      Component Value Date/Time   NA 140  07/10/2017 0910   K 4.3 07/10/2017 0910   CL 101 03/18/2012 1433   CO2 26 07/10/2017 0910   BUN 13.5 07/10/2017 0910   CREATININE 1.0 07/10/2017 0910      Component Value Date/Time   CALCIUM 9.4 07/10/2017 0910   ALKPHOS 75 07/10/2017 0910   AST 17 07/10/2017 0910   ALT 12 07/10/2017 0910   BILITOT 0.51 07/10/2017 0910       Lab Results  Component Value Date   LABCA2 21 03/18/2012    No components found for: GXQJJ941  No results for input(s): INR in the last 168 hours.  Urinalysis No results found for: COLORURINE  STUDIES: Mammography at [facility] earlier this month showed no evidence of malignancy per report  ASSESSMENT: 84 y.o. BRCA negative [place] woman  (1) status post double right-sided lumpectomies August 2011 for a T1c and T1b N0, stage IA invasive ductal carcinomasInvolving overlapping Oneill effects, both strongly estrogen and progesterone receptor positive, HER2/neu negative, with low proliferation fractions.   (2) completed radiation therapy October 2011  (3) refused adjuvant antiestrogens.   (4) left upper inner quadrant lumpectomy 06/09/2015 for DCIS, ER/PR positive, HER2/neu negative, positive margins   (5) adjuvant radiation 08/04/2015-09/02/2015 Left breast 42.72 gray in 16 fractions; lumpectomy cavity boost 10 gray in 5 fractions, cumulative dose 52.72 gray to the lumpectomy cavity  (6) started tamoxifen 09/18/2015  PLAN:  Michell is now a little over 3 years out from definitive surgery for her left-sided breast cancer, with no evidence of disease recurrence.  This is very favorable.  She continues on tamoxifen with no significant Oneill effects.  The plan is for a total of 5 years on that medication.  I suggested she consider our trails to recovery program but I also suggested she get herself a cane. Even though she is very stable on a flat surface she may not be if she is going to be walking outdoors.  She does know to call for any other issues  that may develop before her next visit here, which will be in 1 year. Magrinat, Virgie Dad, MD  07/16/18 1:23 PM Medical Oncology and Hematology [facility] [address] [place],  [address] Tel. [phone]    Fax. [phone]  This document serves as a record of services personally performed by Lurline Del, MD. It was created on his behalf by Wilburn Mylar, a trained medical scribe. The creation of this record is based on the scribe's personal observations and the provider's statements to them.   Lindie Spruce MD, have reviewed the above documentation for accuracy  and completeness, and I agree with the above.  Addendum  Mammography at [facility] 06/24/2018 found the breast density to be category C.  There was no mammographic evidence of malignancy but an area of fullness in the right breast at 12:00 middle third was further evaluated with ultrasonography on the same day.  On physical exam there was no mass or abnormality appreciated there (12:00 5 cm from the nipple).  Ultrasonography of the right breast showed no suspicious abnormalities, only normal fibroglandular tissue.

## 2018-07-22 DIAGNOSIS — Z0000 Encounter for general adult medical examination without abnormal findings: Secondary | ICD-10-CM | POA: Diagnosis not present

## 2018-07-22 DIAGNOSIS — R7309 Other abnormal glucose: Secondary | ICD-10-CM | POA: Diagnosis not present

## 2018-07-22 DIAGNOSIS — Z23 Encounter for immunization: Secondary | ICD-10-CM | POA: Diagnosis not present

## 2018-07-22 DIAGNOSIS — E782 Mixed hyperlipidemia: Secondary | ICD-10-CM | POA: Diagnosis not present

## 2018-07-22 DIAGNOSIS — K219 Gastro-esophageal reflux disease without esophagitis: Secondary | ICD-10-CM | POA: Diagnosis not present

## 2018-07-22 DIAGNOSIS — I10 Essential (primary) hypertension: Secondary | ICD-10-CM | POA: Diagnosis not present

## 2018-08-20 DIAGNOSIS — Z85828 Personal history of other malignant neoplasm of skin: Secondary | ICD-10-CM | POA: Diagnosis not present

## 2018-08-20 DIAGNOSIS — Z08 Encounter for follow-up examination after completed treatment for malignant neoplasm: Secondary | ICD-10-CM | POA: Diagnosis not present

## 2018-09-04 DIAGNOSIS — R1011 Right upper quadrant pain: Secondary | ICD-10-CM | POA: Diagnosis not present

## 2018-09-04 DIAGNOSIS — G2581 Restless legs syndrome: Secondary | ICD-10-CM | POA: Diagnosis not present

## 2018-09-04 DIAGNOSIS — M150 Primary generalized (osteo)arthritis: Secondary | ICD-10-CM | POA: Diagnosis not present

## 2018-09-04 DIAGNOSIS — I10 Essential (primary) hypertension: Secondary | ICD-10-CM | POA: Diagnosis not present

## 2018-09-04 DIAGNOSIS — K219 Gastro-esophageal reflux disease without esophagitis: Secondary | ICD-10-CM | POA: Diagnosis not present

## 2018-09-04 DIAGNOSIS — E782 Mixed hyperlipidemia: Secondary | ICD-10-CM | POA: Diagnosis not present

## 2018-09-04 DIAGNOSIS — C50219 Malignant neoplasm of upper-inner quadrant of unspecified female breast: Secondary | ICD-10-CM | POA: Diagnosis not present

## 2018-09-04 DIAGNOSIS — M810 Age-related osteoporosis without current pathological fracture: Secondary | ICD-10-CM | POA: Diagnosis not present

## 2018-09-25 DIAGNOSIS — R1011 Right upper quadrant pain: Secondary | ICD-10-CM | POA: Diagnosis not present

## 2018-09-25 DIAGNOSIS — R160 Hepatomegaly, not elsewhere classified: Secondary | ICD-10-CM | POA: Diagnosis not present

## 2018-09-26 ENCOUNTER — Other Ambulatory Visit: Payer: Self-pay | Admitting: Internal Medicine

## 2018-09-26 DIAGNOSIS — R160 Hepatomegaly, not elsewhere classified: Secondary | ICD-10-CM

## 2018-09-26 DIAGNOSIS — R1011 Right upper quadrant pain: Secondary | ICD-10-CM | POA: Diagnosis not present

## 2018-09-29 ENCOUNTER — Ambulatory Visit
Admission: RE | Admit: 2018-09-29 | Discharge: 2018-09-29 | Disposition: A | Discharge status: Home / Self Care | Payer: Medicare Other | Source: Ambulatory Visit | Attending: Internal Medicine | Admitting: Internal Medicine

## 2018-09-29 ENCOUNTER — Encounter: Payer: Self-pay | Admitting: Radiology

## 2018-09-29 DIAGNOSIS — K5730 Diverticulosis of large intestine without perforation or abscess without bleeding: Secondary | ICD-10-CM | POA: Diagnosis not present

## 2018-09-29 DIAGNOSIS — R160 Hepatomegaly, not elsewhere classified: Secondary | ICD-10-CM

## 2018-09-29 MED ORDER — IOPAMIDOL (ISOVUE-300) INJECTION 61%
100.0000 mL | Freq: Once | INTRAVENOUS | Status: AC | PRN
  Administered 2018-09-29: 100 mL via INTRAVENOUS

## 2018-10-02 DIAGNOSIS — M810 Age-related osteoporosis without current pathological fracture: Secondary | ICD-10-CM | POA: Diagnosis not present

## 2019-04-06 DIAGNOSIS — M810 Age-related osteoporosis without current pathological fracture: Secondary | ICD-10-CM | POA: Diagnosis not present

## 2019-05-29 DIAGNOSIS — Z23 Encounter for immunization: Secondary | ICD-10-CM | POA: Diagnosis not present

## 2019-06-25 ENCOUNTER — Other Ambulatory Visit: Payer: Self-pay | Admitting: Oncology

## 2019-06-25 ENCOUNTER — Encounter: Payer: Self-pay | Admitting: Oncology

## 2019-06-25 DIAGNOSIS — Z853 Personal history of malignant neoplasm of breast: Secondary | ICD-10-CM | POA: Diagnosis not present

## 2019-08-10 ENCOUNTER — Telehealth: Payer: Self-pay | Admitting: Oncology

## 2019-08-10 NOTE — Telephone Encounter (Signed)
Changed appt per 11/23 sch message  - pt is aware of appt being a phone call .

## 2019-08-18 NOTE — Progress Notes (Signed)
ID: Tammy Oneill   DOB: [birth year]  MR#: [id number]  CSN#:[id number]   Patient Care Team: Merrilee Seashore, MD as PCP - General (Internal Medicine) Rolm Bookbinder, MD as Consulting Physician (General Surgery) Gery Pray, MD as Consulting Physician (Radiation Oncology) Jaques Mineer, Virgie Dad, MD as Consulting Physician (Oncology) Sylvan Cheese, NP as Nurse Practitioner (Hematology and Oncology) OTHER MD:   I connected with Tammy Oneill on 08/19/19 at  1:30 PM EST by telephone visit and verified that I am speaking with the correct person using two identifiers.   I discussed the limitations, risks, security and privacy concerns of performing an evaluation and management service by telemedicine and the availability of in-person appointments. I also discussed with the patient that there may be a patient responsible charge related to this service. The patient expressed understanding and agreed to proceed.   Other persons participating in the visit and their role in the encounter: None  Patient's location: home  Provider's location: [place]: Estrogen receptor positive breast cancer  CURRENT TREATMENT: tamoxifen   INTERVAL HISTORY: Tammy Oneill was contacted today for follow-up of her estrogen receptor positive breast cancer.   She continues on tamoxifen.  She tolerates this well, with no side effects that she is aware of.  Since her last visit, she underwent bilateral diagnostic mammography with tomography at [facility] on 06/24/2018 showing: breast density category C; no evidence of malignancy in either breast.  Right breast ultrasonography the same day and physical exam showed no mass or abnormality in the area of the right breast (superior) felt like some fullness to the patient.  There was only normal fibroglandular tissue in that area.  She also underwent abdomen CT on 09/29/2018 for follow up of abnormal ultrasound. This showed: no suspicious  hepatic lesion or acute findings.   REVIEW OF SYSTEMS: Tammy Oneill drives, does all her housework, and does all her own shopping.  Her daughter from [place] is currently staying with her.  The daughter is working from [employer], virtually.  Both of them are taking appropriate pandemic precautions.  A detailed review of systems was otherwise noncontributory   BREAST CANCER HISTORY: From the original intake note:   She was feeling fine, and had no particular symptoms when she had routine mammographic screening at [facility] March 02, 2010.  Her prior mammogram had been in 2008.  This time Dr. Isaiah Blakes noted an irregular area of focal asymmetry in the upper outer quadrant of the right breast, and the patient was brought back for a diagnostic mammography and right breast ultrasonography March 13, 2010.  This confirmed an irregular ill defined, mixed density/hyperdense mass in the right breast with some spiculation and distortion, which by ultrasound measured 1.2 cm.  There was also a smaller, though otherwise similar lesion more posteriorly, and that one measured 8 mm by ultrasound.  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  Both were estrogen and progesterone receptor positive and HER2-, and both had a low proliferation fraction, although morphologically there was some difference between the masses, so that at the Multidisciplinary Breast Cancer Conference this morning, it was felt most likely this were two concurrent primaries.  With this information the patient was referred to Dr. Donne Hazel, and bilateral breast MRIs were obtained January 7th.  This showed the larger mass to be 1.4 cm, the smaller one to measure 1.1 cm, but there was  no other finding of significance in either breast, and there was no evidence of adenopathy.   Accordingly, the patient proceeded to definitive lumpectomies, which were  performed August 17.  The final pathology from this procedure ([id number]) showed two invasive ductal carcinomas, the larger measuring 1.4 cm, the smaller 7 mm, again both ER/PR+, and HER2-.  Both were grade 1.  Margins were close, although negative, and there was no evidence of lymphovascular invasion.  Zero of two sentinel lymph nodes were involved.  The patient is staged at T1c N0, or stage I.  Her subsequent history is as detailed below.   PAST MEDICAL HISTORY: Past Medical History:  Diagnosis Date  . Anxiety   . Bilateral breast cancer ([place])   . GERD (gastroesophageal reflux disease)   . Hyperlipidemia   . Hypertension   . Osteopenia    on actonel  . PONV (postoperative nausea and vomiting)   . Radiation 08/04/15-09/02/15   left breast 42.72 gray, boosted o 52.72 gray  The past medical history is significant for osteopenia, history of cervical cancer status post simple hysterectomy without salpingo oophorectomy at the age of 32, history of osteoarthritis especially involving the right shoulder where she says she has a fairly chronic bursitis, hypercholesterolemia, history of frequent urinary tract infections, history of appendectomy, history of cataracts, not yet surgically removed (she tells me Dr. Katy Fitch is planning to do this in January), and history of "an enlarged heart" although this was not seen in the most recent chest x-ray.     PAST SURGICAL HISTORY: Past Surgical History:  Procedure Laterality Date  . ABDOMINAL HYSTERECTOMY    . APPENDECTOMY    . BREAST LUMPECTOMY WITH RADIOACTIVE SEED LOCALIZATION Left 06/09/2015   Procedure: BREAST LUMPECTOMY WITH RADIOACTIVE SEED LOCALIZATION;  Surgeon: Rolm Bookbinder, MD;  Location: [place] [facility];  Service: General;  Laterality: Left;  . BREAST SURGERY Right 2011   lumpectomy Dr Donne Hazel    FAMILY HISTORY Family History  Problem Relation Age of Onset  . Lung cancer Father   . Bladder Cancer Mother    The  patient's mother died with bladder cancer at the age of 70.  The patient's father died with lung cancer at the age of 30.  She had one brother who died in an automobile accident. One sister is "okay".  There is no breast or ovarian cancer in the family except for the patient's older daughter, [name], who lives in [place], and was diagnosed with breast cancer at the age of 36. She was [facility] at [facility].    GYNECOLOGIC HISTORY: GX P5.  First pregnancy to term at age 34.  Hysterectomy at age 13.  She took Premarin for many years, stopping about ten years ago.    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  She has been divorced twice, lives home alone, has no pets.  Son, [name], works in a Psychologist, prison and probation services company here in [place], as does son, [name].  Daughter, [name] in [place] had breast cancer. Daughter,[name] is also my patient and works at [employer] here in [place]. Daughter, [name], works in Engineer, technical sales in [place], [place].  The patient has nine grandchildren and three great-grandchildren.  She attends [employer].      ADVANCED DIRECTIVES:   HEALTH MAINTENANCE: Social History   Tobacco Use  . Smoking status: Never  Smoker  . Smokeless tobacco: Never Used  Substance Use Topics  . Alcohol use: No  . Drug use: No     Colonoscopy:  PAP:  Bone density:  Lipid panel:  Allergies  Allergen Reactions  . Morphine And Related     vomiting    Current Outpatient Medications  Medication Sig Dispense Refill  . aspirin 81 MG tablet Take 81 mg by mouth daily.     Marland Kitchen atorvastatin (LIPITOR) 10 MG tablet Take 10 mg by mouth daily.  6  . metoprolol succinate (TOPROL-XL) 25 MG 24 hr tablet Take 1/2 tablet daily 45 tablet 3  . multivitamin-iron-minerals-folic acid (CENTRUM) chewable tablet Chew 1 tablet by mouth daily.    . naproxen  sodium (ALEVE) 220 MG tablet Take 1 tablet (220 mg total) by mouth daily as needed.    . tamoxifen (NOLVADEX) 20 MG tablet TAKE 1 TABLET BY MOUTH EVERY DAY 90 tablet 3  . telmisartan-hydrochlorothiazide (MICARDIS HCT) 80-12.5 MG tablet TAKE 1 TABLET BY MOUTH EVERY DAY 30 tablet 0  . vitamin B-12 (CYANOCOBALAMIN) 500 MCG tablet Take 1 tablet (500 mcg total) by mouth daily.     No current facility-administered medications for this visit.     OBJECTIVE: Elderly white woman   There were no vitals filed for this visit.   There is no height or weight on file to calculate BMI.    ECOG FS: 1  Televisit  RESULTS: Lab Results  Component Value Date   WBC 5.9 07/16/2018   NEUTROABS 3.3 07/16/2018   HGB 11.9 (L) 07/16/2018   HCT 35.2 (L) 07/16/2018   MCV 98.1 07/16/2018   PLT 159 07/16/2018      Chemistry      Component Value Date/Time   NA 139 07/16/2018 1255   NA 140 07/10/2017 0910   K 3.8 07/16/2018 1255   K 4.3 07/10/2017 0910   CL 103 07/16/2018 1255   CO2 29 07/16/2018 1255   CO2 26 07/10/2017 0910   BUN 14 07/16/2018 1255   BUN 13.5 07/10/2017 0910   CREATININE 1.12 (H) 07/16/2018 1255   CREATININE 1.0 07/10/2017 0910      Component Value Date/Time   CALCIUM 9.3 07/16/2018 1255   CALCIUM 9.4 07/10/2017 0910   ALKPHOS 74 07/16/2018 1255   ALKPHOS 75 07/10/2017 0910   AST 16 07/16/2018 1255   AST 17 07/10/2017 0910   ALT 13 07/16/2018 1255   ALT 12 07/10/2017 0910   BILITOT 0.5 07/16/2018 1255   BILITOT 0.51 07/10/2017 0910       Lab Results  Component Value Date   LABCA2 21 03/18/2012    No components found for: [id number]  No results for input(s): INR in the last 168 hours.  Urinalysis No results found for: COLORURINE  STUDIES: No results found.   ASSESSMENT: 85 y.o. BRCA negative [place] woman  (1) status post double right-sided lumpectomies August 2011 for a T1c and T1b N0, stage IA invasive ductal carcinomasInvolving overlapping side effects,  both strongly estrogen and progesterone receptor positive, HER2/neu negative, with low proliferation fractions.   (2) completed radiation therapy October 2011  (3) refused adjuvant antiestrogens.   (4) left upper inner quadrant lumpectomy 06/09/2015 for DCIS, ER/PR positive, HER2/neu negative, positive margins   (5) adjuvant radiation 08/04/2015-09/02/2015 Left breast 42.72 gray in 16 fractions; lumpectomy cavity boost 10 gray in 5 fractions, cumulative dose 52.72 gray to the lumpectomy cavity  (6) started tamoxifen 09/18/2015  PLAN:  Tammy Oneill is now a  little over 4 years out from definitive surgery for her breast cancer with no evidence of disease recurrence.  This is very favorable.  She is tolerating tamoxifen well.  The plan is to continue that through December 2021  She will have her next mammogram November 2021 and she will see me shortly after that.  That will be her "graduation" visit.  She is taking appropriate pandemic precautions  She knows to call for any other issue that may develop before the next visit.  Iva Posten, Virgie Dad, MD  08/19/19 1:47 PM Medical Oncology and Hematology [facility] [place], [place] [address] Tel. [phone]    Fax. [phone]   This document serves as a record of services personally performed by Lurline Del, MD. It was created on his behalf by Wilburn Mylar, a trained medical scribe. The creation of this record is based on the scribe's personal observations and the provider's statements to them.   I, Lurline Del MD, have reviewed the above documentation for accuracy and completeness, and I agree with the above.

## 2019-08-19 ENCOUNTER — Telehealth: Payer: Self-pay | Admitting: Oncology

## 2019-08-19 ENCOUNTER — Other Ambulatory Visit: Payer: No Typology Code available for payment source

## 2019-08-19 ENCOUNTER — Inpatient Hospital Stay: Payer: Medicare Other | Attending: Oncology | Admitting: Oncology

## 2019-08-19 DIAGNOSIS — Z170 Estrogen receptor positive status [ER+]: Secondary | ICD-10-CM | POA: Diagnosis not present

## 2019-08-19 DIAGNOSIS — C50212 Malignant neoplasm of upper-inner quadrant of left female breast: Secondary | ICD-10-CM | POA: Diagnosis not present

## 2019-08-19 DIAGNOSIS — C50811 Malignant neoplasm of overlapping sites of right female breast: Secondary | ICD-10-CM | POA: Diagnosis not present

## 2019-08-19 NOTE — Telephone Encounter (Signed)
I talk with patient also faxed to [facility]

## 2019-09-23 DIAGNOSIS — Z0000 Encounter for general adult medical examination without abnormal findings: Secondary | ICD-10-CM | POA: Diagnosis not present

## 2019-09-23 DIAGNOSIS — M810 Age-related osteoporosis without current pathological fracture: Secondary | ICD-10-CM | POA: Diagnosis not present

## 2019-09-23 DIAGNOSIS — Z23 Encounter for immunization: Secondary | ICD-10-CM | POA: Diagnosis not present

## 2019-09-30 DIAGNOSIS — N1831 Chronic kidney disease, stage 3a: Secondary | ICD-10-CM | POA: Diagnosis not present

## 2019-09-30 DIAGNOSIS — C50219 Malignant neoplasm of upper-inner quadrant of unspecified female breast: Secondary | ICD-10-CM | POA: Diagnosis not present

## 2019-09-30 DIAGNOSIS — I10 Essential (primary) hypertension: Secondary | ICD-10-CM | POA: Diagnosis not present

## 2019-09-30 DIAGNOSIS — E782 Mixed hyperlipidemia: Secondary | ICD-10-CM | POA: Diagnosis not present

## 2019-09-30 DIAGNOSIS — M150 Primary generalized (osteo)arthritis: Secondary | ICD-10-CM | POA: Diagnosis not present

## 2019-09-30 DIAGNOSIS — M810 Age-related osteoporosis without current pathological fracture: Secondary | ICD-10-CM | POA: Diagnosis not present

## 2019-10-12 DIAGNOSIS — M810 Age-related osteoporosis without current pathological fracture: Secondary | ICD-10-CM | POA: Diagnosis not present

## 2019-11-05 IMAGING — CT CT ABDOMEN WO/W CM
2 of 12 series · 14 of 46 positions shown, 16 images · IV contrast (iopamidol)
Comparison: No priors.

CLINICAL DATA: 85-year-old female with history of abnormal
ultrasound suspicious for liver mass. Follow-up study.

EXAM:
CT ABDOMEN WITHOUT AND WITH CONTRAST
TECHNIQUE: Multidetector CT imaging of the abdomen was performed following the
standard protocol before and following the bolus administration of
intravenous contrast.
CONTRAST:  100mL UY21VC-Q33 IOPAMIDOL (UY21VC-Q33) INJECTION 61%

[Series 6: arterial phase liver 2.00 br40 s3 cor · coronal · arterial · 0.34mm/px · 3 of 133 slices shown]
[im 34/133  soft-tissue]
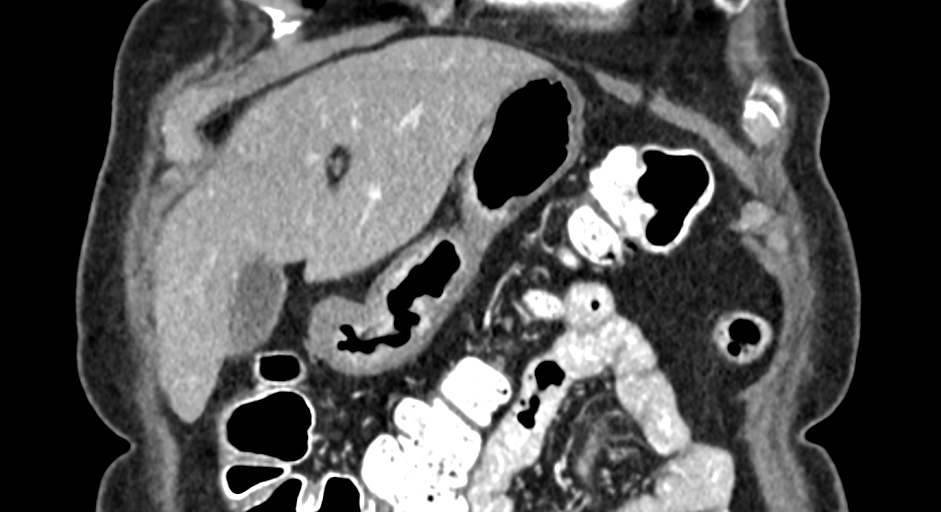
[im 67/133  soft-tissue]
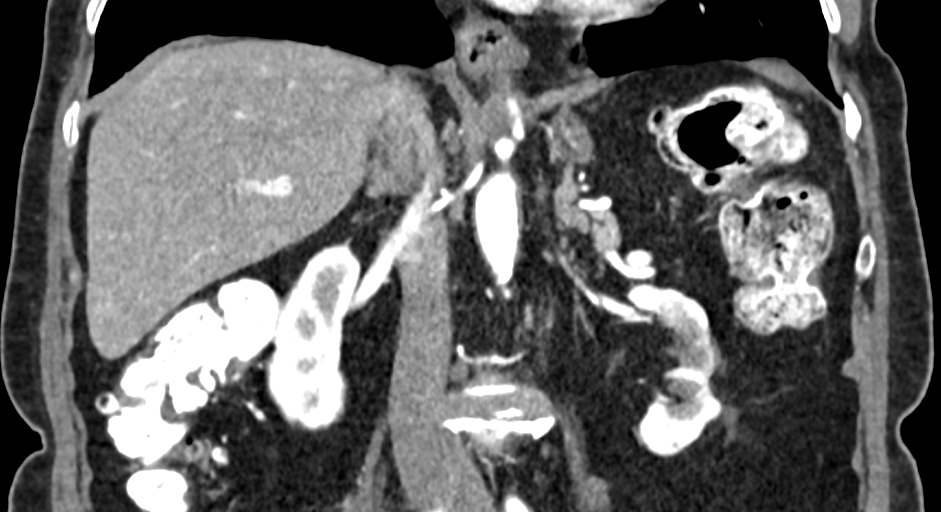
[im 100/133  soft-tissue]
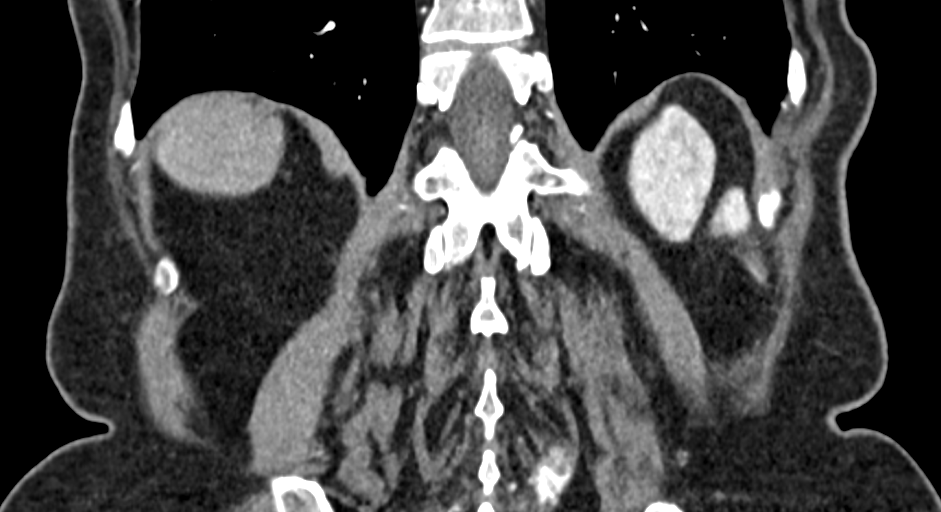

[Series 21: venous phase liver 1.00 br40 s3 · axial · portal-venous · 0.60mm/px · z∈[+1267,+1445]mm · 11 of 214 slices shown, 13 images]
[im 18/214  soft-tissue]
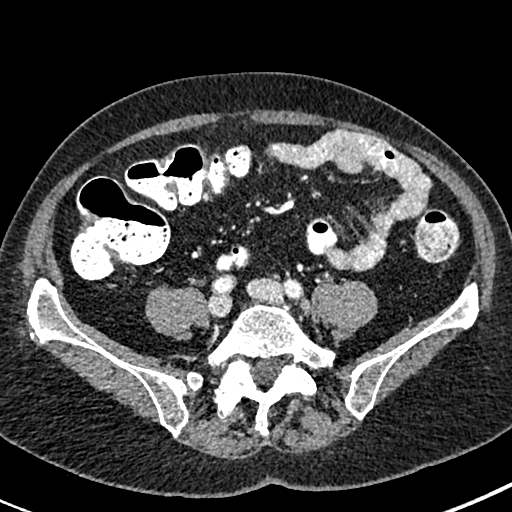
[im 18/214  bone]
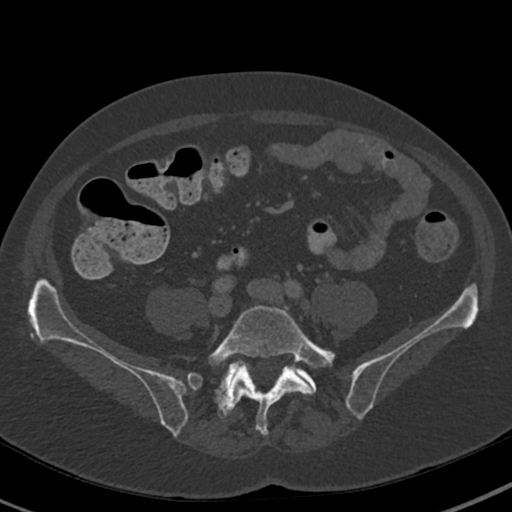
[im 36/214  soft-tissue]
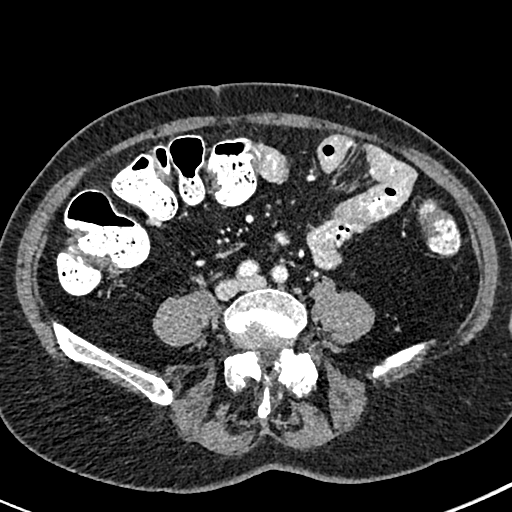
[im 54/214  soft-tissue]
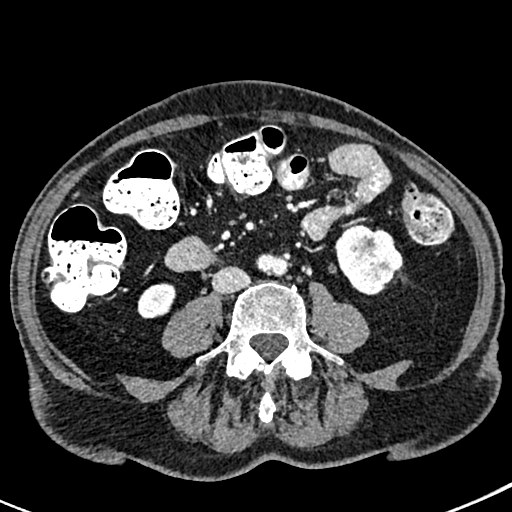
[im 72/214  soft-tissue]
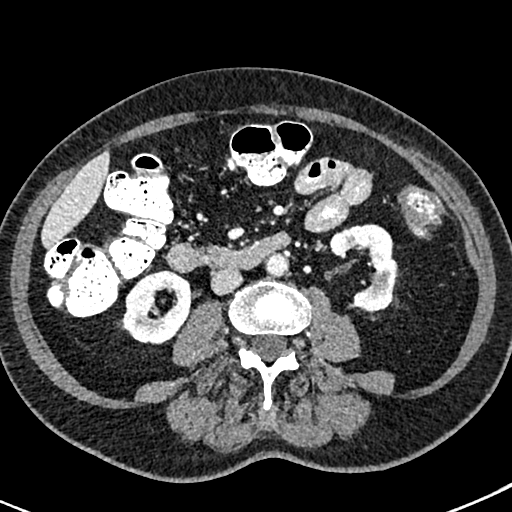
[im 89/214  soft-tissue]
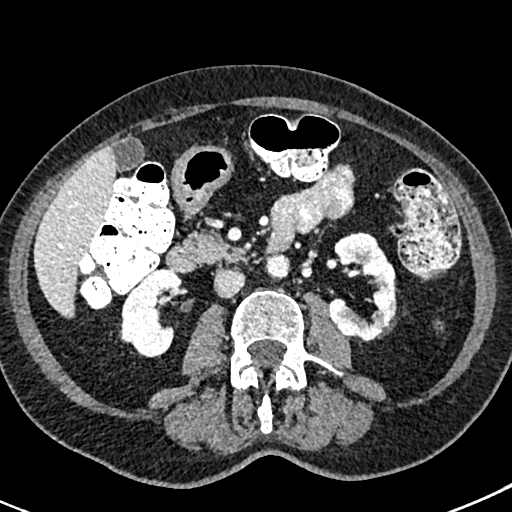
[im 107/214  soft-tissue]
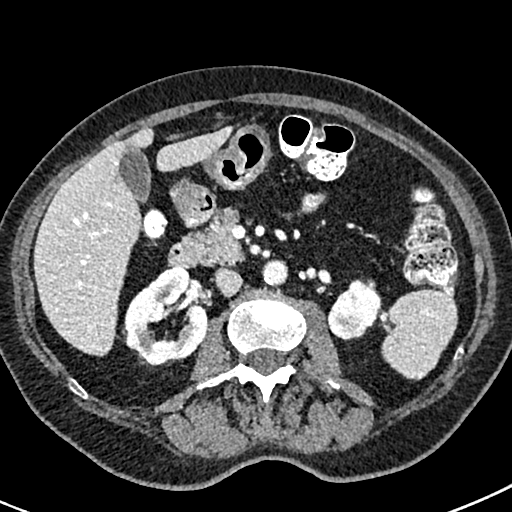
[im 125/214  soft-tissue]
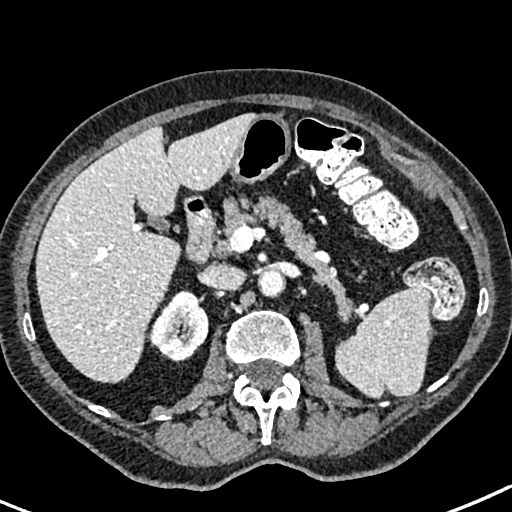
[im 143/214  soft-tissue]
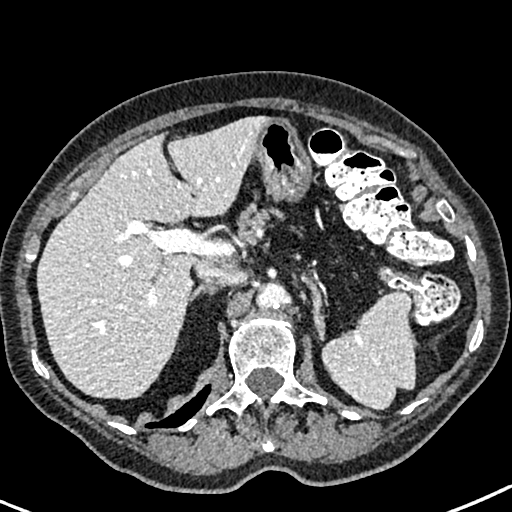
[im 160/214  soft-tissue]
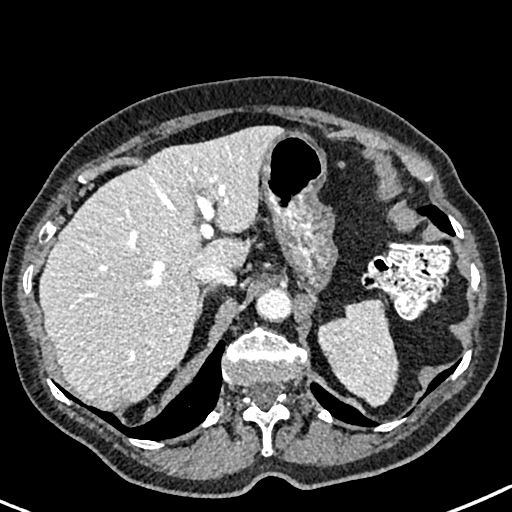
[im 160/214  bone]
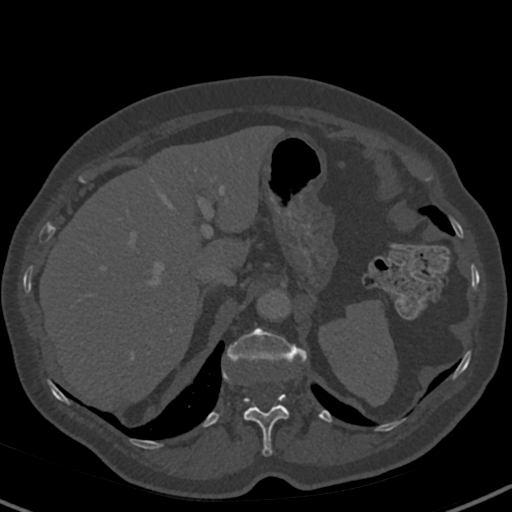
[im 178/214  soft-tissue]
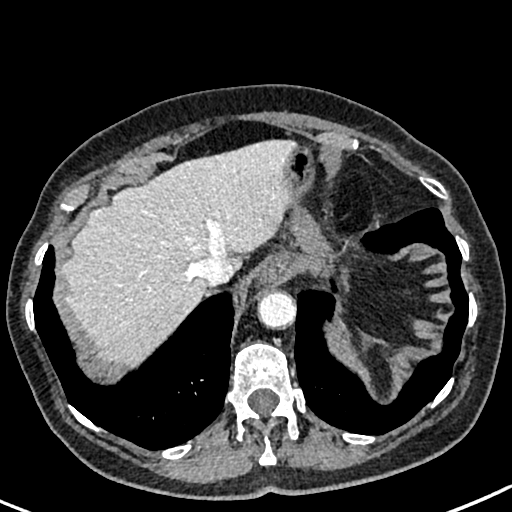
[im 196/214  soft-tissue]
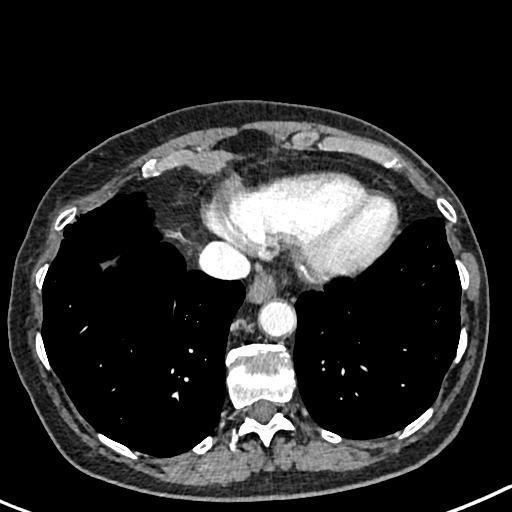

[14 of 46 positions shown; findings below may reference images not displayed]

FINDINGS: Lower chest: Aortic atherosclerosis.

Hepatobiliary: No suspicious cystic or solid hepatic lesions. No
intra or extrahepatic biliary ductal dilatation. Gallbladder is
normal in appearance.

Pancreas: No pancreatic mass. No pancreatic ductal dilatation. No
pancreatic or peripancreatic fluid or inflammatory changes.

Spleen: Trace amount of perisplenic ascites. Otherwise,
unremarkable.

Adrenals/Urinary Tract: No calculi are identified within the
collecting system of either kidney or along the visualized course of
either ureter. No suspicious renal lesions. Multifocal cortical
thinning in the kidneys bilaterally. Bilateral adrenal glands are
normal in appearance.

Stomach/Bowel: Normal appearance of the stomach. No pathologic
dilatation of visualized small bowel or colon. A few scattered
colonic diverticulae are noted without surrounding inflammatory
changes in the visualized portions of the abdomen.

Vascular/Lymphatic: Aortic atherosclerosis, without evidence of
aneurysm or dissection in the abdominal vasculature. No
lymphadenopathy noted in the abdomen.

Other: No significant volume of ascites noted in the visualized
portions of the abdomen. No lymphadenopathy noted in the abdomen.

Musculoskeletal: There are no aggressive appearing lytic or blastic
lesions noted in the visualized portions of the skeleton.
IMPRESSION: 1. No suspicious hepatic lesion noted on today's examination.
2. No acute findings are noted in the abdomen.
3. Mild colonic diverticulosis without evidence of acute
diverticulitis in the visualized portions of the abdomen.
4. Multifocal cortical thinning throughout the kidneys bilaterally.
5. Aortic atherosclerosis.

## 2020-04-14 DIAGNOSIS — M810 Age-related osteoporosis without current pathological fracture: Secondary | ICD-10-CM | POA: Diagnosis not present

## 2020-04-26 DIAGNOSIS — L570 Actinic keratosis: Secondary | ICD-10-CM | POA: Diagnosis not present

## 2020-04-26 DIAGNOSIS — X32XXXD Exposure to sunlight, subsequent encounter: Secondary | ICD-10-CM | POA: Diagnosis not present

## 2020-04-26 DIAGNOSIS — D225 Melanocytic nevi of trunk: Secondary | ICD-10-CM | POA: Diagnosis not present

## 2020-07-11 DIAGNOSIS — Z23 Encounter for immunization: Secondary | ICD-10-CM | POA: Diagnosis not present

## 2020-07-31 NOTE — Progress Notes (Signed)
ID: Tammy Oneill   DOB: 09/22/1932  MR#: [id number]  QPR#:[id number]   Patient Care Team: Merrilee Seashore, MD as PCP - General (Internal Medicine) Rolm Bookbinder, MD as Consulting Physician (General Surgery) Gery Pray, MD as Consulting Physician (Radiation Oncology) Alleyne Lac, Virgie Dad, MD as Consulting Physician (Oncology) Sylvan Cheese, NP as Nurse Practitioner (Hematology and Oncology) OTHER MD:    CHIEF COMPLAINT: Estrogen receptor positive breast cancer  CURRENT TREATMENT: Completing 5 years of tamoxifen   INTERVAL HISTORY: Tammy Oneill returns today for follow-up of her estrogen receptor positive breast cancer.   She continues on tamoxifen.  She tolerates this well, with no hot flashes and only mild vaginal discharge.  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  A detailed review of systems today was otherwise benign.   COVID 19 VACCINATION STATUS: Status post Pfizer vaccine x2 and booster   BREAST CANCER HISTORY: From the original intake note:   She was feeling fine, and had no particular symptoms when she had routine mammographic screening at [facility] March 02, 2010.  Her prior mammogram had been in 2008.  This time Dr. Isaiah Blakes noted an irregular area of focal asymmetry in the upper outer quadrant of the right breast, and the patient was brought back for a diagnostic mammography and right breast ultrasonography March 13, 2010.  This confirmed an irregular ill defined, mixed density/hyperdense mass in the right breast with some spiculation and distortion, which by ultrasound measured 1.2 cm.  There was also a smaller, though otherwise similar lesion more posteriorly, and that one measured 8 mm by ultrasound.  Definitive biopsy of both masses was performed June 29th, and showed ([id number]) both  masses to be invasive ductal carcinomas, both low grade.  Both were estrogen and progesterone receptor positive and HER2-, and both had a low proliferation fraction, although morphologically there was some difference between the masses, so that at the Multidisciplinary Breast Cancer Conference this morning, it was felt most likely this were two concurrent primaries.  With this information the patient was referred to Dr. Donne Hazel, and bilateral breast MRIs were obtained January 7th.  This showed the larger mass to be 1.4 cm, the smaller one to measure 1.1 cm, but there was no other finding of significance in either breast, and there was no evidence of adenopathy.   Accordingly, the patient proceeded to definitive lumpectomies, which were performed August 17.  The final pathology from this procedure ([id number]) showed two invasive ductal carcinomas, the larger measuring 1.4 cm, the smaller 7 mm, again both ER/PR+, and HER2-.  Both were grade 1.  Margins were close, although negative, and there was no evidence of lymphovascular invasion.  Zero of two sentinel lymph nodes were involved.  The patient is staged at T1c N0, or stage I.  Her subsequent history is as detailed below.   PAST MEDICAL HISTORY: Past Medical History:  Diagnosis Date  . Anxiety   . Bilateral breast cancer ([place])   . GERD (gastroesophageal reflux disease)   . Hyperlipidemia   . Hypertension   . Osteopenia    on actonel  . PONV (postoperative nausea and vomiting)   . Radiation 08/04/15-09/02/15   left breast 42.72 gray, boosted o 52.72 gray  The past medical history is significant for osteopenia, history of  cervical cancer status post simple hysterectomy without salpingo oophorectomy at the age of 46, history of osteoarthritis especially involving the right shoulder where she says she has a fairly chronic bursitis, hypercholesterolemia, history of frequent urinary tract infections, history of appendectomy, history of  cataracts, not yet surgically removed (she tells me Dr. Katy Fitch is planning to do this in January), and history of "an enlarged heart" although this was not seen in the most recent chest x-ray.     PAST SURGICAL HISTORY: Past Surgical History:  Procedure Laterality Date  . ABDOMINAL HYSTERECTOMY    . APPENDECTOMY    . BREAST LUMPECTOMY WITH RADIOACTIVE SEED LOCALIZATION Left 06/09/2015   Procedure: BREAST LUMPECTOMY WITH RADIOACTIVE SEED LOCALIZATION;  Surgeon: Rolm Bookbinder, MD;  Location: [facility];  Service: General;  Laterality: Left;  . BREAST SURGERY Right 2011   lumpectomy Dr Donne Hazel    FAMILY HISTORY Family History  Problem Relation Age of Onset  . Lung cancer Father   . Bladder Cancer Mother    The patient's mother died with bladder cancer at the age of 67.  The patient's father died with lung cancer at the age of 6.  She had one brother who died in an automobile accident. One sister is "okay".  There is no breast or ovarian cancer in the family except for the patient's older daughter, [name], who lives in [place], and was diagnosed with breast cancer at the age of 26. She was treated at [facility]    GYNECOLOGIC HISTORY: GX P5.  First pregnancy to term at age 78.  Hysterectomy at age 58.  She took Premarin for many years, stopping about ten years ago.    SOCIAL HISTORY: She worked as a Quarry manager for [employer].  She has been divorced twice, lives home alone, has no pets.  Son, [name], works in a Psychologist, prison and probation services company here in [place], as does son, [name].  Daughter, [name] in [place] had breast cancer. Daughter, Alexandria Lodge is also my patient and works at [employer] here in [place]. Daughter, [name], works in Engineer, technical sales in [place], [place].  The patient has nine grandchildren and three great-grandchildren.  She attends [employer].       HEALTH MAINTENANCE: Social History    Tobacco Use  . Smoking status: Never Smoker  . Smokeless tobacco: Never Used  Substance Use Topics  . Alcohol use: No  . Drug use: No     Allergies  Allergen Reactions  . Morphine And Related     vomiting    Current Outpatient Medications  Medication Sig Dispense Refill  . aspirin 81 MG tablet Take 81 mg by mouth daily.     Marland Kitchen atorvastatin (LIPITOR) 10 MG tablet Take 10 mg by mouth daily.  6  . metoprolol succinate (TOPROL-XL) 25 MG 24 hr tablet Take 1/2 tablet daily 45 tablet 3  . multivitamin-iron-minerals-folic acid (CENTRUM) chewable tablet Chew 1 tablet by mouth daily.    . naproxen sodium (ALEVE) 220 MG tablet Take 1 tablet (220 mg total) by mouth daily as needed.    . tamoxifen (NOLVADEX) 20 MG tablet TAKE 1 TABLET BY MOUTH EVERY DAY 90 tablet 3  . telmisartan-hydrochlorothiazide (MICARDIS HCT) 80-12.5 MG tablet TAKE 1 TABLET BY MOUTH EVERY DAY 30 tablet 0  . vitamin B-12 (CYANOCOBALAMIN) 500 MCG tablet Take 1 tablet (500 mcg total) by mouth daily.     No current facility-administered medications for this  visit.    OBJECTIVE: White woman in no acute distress  Vitals:   08/01/20 1443  BP: (!) 142/55  Pulse: 75  Resp: 18  Temp: 97.7 F (36.5 C)  SpO2: 98%     Body mass index is 26.47 kg/m.    ECOG FS: 1  Sclerae unicteric, EOMs intact Wearing a mask No cervical or supraclavicular adenopathy Lungs no rales or rhonchi Heart regular rate and rhythm Abd soft, nontender, positive bowel sounds MSK no focal spinal tenderness, no upper extremity lymphedema Neuro: nonfocal, well oriented, appropriate affect Breasts: Status post bilateral lumpectomies.  No evidence of local recurrence.  Both axillae are benign.   RESULTS: Lab Results  Component Value Date   WBC 5.3 08/01/2020   NEUTROABS 2.5 08/01/2020   HGB 12.1 08/01/2020   HCT 35.2 (L) 08/01/2020   MCV 97.5 08/01/2020   PLT 166 08/01/2020      Chemistry      Component Value Date/Time   NA 138  08/01/2020 1426   NA 140 07/10/2017 0910   K 3.8 08/01/2020 1426   K 4.3 07/10/2017 0910   CL 103 08/01/2020 1426   CO2 27 08/01/2020 1426   CO2 26 07/10/2017 0910   BUN 14 08/01/2020 1426   BUN 13.5 07/10/2017 0910   CREATININE 1.20 (H) 08/01/2020 1426   CREATININE 1.12 (H) 07/16/2018 1255   CREATININE 1.0 07/10/2017 0910      Component Value Date/Time   CALCIUM 9.1 08/01/2020 1426   CALCIUM 9.4 07/10/2017 0910   ALKPHOS 58 08/01/2020 1426   ALKPHOS 75 07/10/2017 0910   AST 22 08/01/2020 1426   AST 16 07/16/2018 1255   AST 17 07/10/2017 0910   ALT 16 08/01/2020 1426   ALT 13 07/16/2018 1255   ALT 12 07/10/2017 0910   BILITOT 0.8 08/01/2020 1426   BILITOT 0.5 07/16/2018 1255   BILITOT 0.51 07/10/2017 0910       Lab Results  Component Value Date   LABCA2 21 03/18/2012    No components found for: [id number]  No results for input(s): INR in the last 168 hours.  Urinalysis No results found for: COLORURINE   STUDIES: No results found.   ASSESSMENT: 86 y.o. BRCA negative [place] woman  (1) status post double right-sided lumpectomies August 2011 for a T1c and T1b N0, stage IA invasive ductal carcinomasInvolving overlapping side effects, both strongly estrogen and progesterone receptor positive, HER2/neu negative, with low proliferation fractions.   (2) completed radiation therapy October 2011  (3) refused adjuvant antiestrogens.   (4) left upper inner quadrant lumpectomy 06/09/2015 for DCIS, ER/PR positive, HER2/neu negative, positive margins   (5) adjuvant radiation 08/04/2015-09/02/2015 Left breast 42.72 gray in 16 fractions; lumpectomy cavity boost 10 gray in 5 fractions, cumulative dose 52.72 gray to the lumpectomy cavity  (6) started tamoxifen 09/18/2015, completing 5 years December 2021   PLAN:  Tammy Oneill is now 10 years out from her right sided breast cancer and a little over 5 years out from her left sided noninvasive breast cancer.  There is no  evidence of disease recurrence.  This is very favorable.  She will complete 5 years of tamoxifen next month.  There is no indication for continuing beyond that point  Accordingly I am comfortable releasing her to her primary care physician.  All she needs in terms of breast cancer follow-up is her yearly mammogram and her yearly physician breast exam  I will be glad to see surely again at any point in the  future if and when the need arises but as of now are making no further routine appointments for her here  Total encounter time was 25 minutes.*   Orrin Yurkovich, Virgie Dad, MD  08/01/20 5:10 PM Medical Oncology and Hematology [facility] [place], [place] [address] Tel. [phone]    Fax. [phone]   This document serves as a record of services personally performed by Lurline Del, MD. It was created on his behalf by Wilburn Mylar, a trained medical scribe. The creation of this record is based on the scribe's personal observations and the provider's statements to them.   I, Lurline Del MD, have reviewed the above documentation for accuracy and completeness, and I agree with the above.   *Total Encounter Time as defined by the Centers for Medicare and Medicaid Services includes, in addition to the face-to-face time of a patient visit (documented in the note above) non-face-to-face time: obtaining and reviewing outside history, ordering and reviewing medications, tests or procedures, care coordination (communications with other health care professionals or caregivers) and documentation in the medical record.

## 2020-08-01 ENCOUNTER — Inpatient Hospital Stay: Payer: Medicare Other

## 2020-08-01 ENCOUNTER — Inpatient Hospital Stay: Payer: Medicare Other | Attending: Oncology | Admitting: Oncology

## 2020-08-01 ENCOUNTER — Other Ambulatory Visit: Payer: Self-pay

## 2020-08-01 VITALS — BP 142/55 | HR 75 | Temp 97.7°F | Resp 18 | Ht 62.0 in | Wt 144.7 lb

## 2020-08-01 DIAGNOSIS — C50212 Malignant neoplasm of upper-inner quadrant of left female breast: Secondary | ICD-10-CM

## 2020-08-01 DIAGNOSIS — C50811 Malignant neoplasm of overlapping sites of right female breast: Secondary | ICD-10-CM | POA: Diagnosis not present

## 2020-08-01 DIAGNOSIS — Z170 Estrogen receptor positive status [ER+]: Secondary | ICD-10-CM | POA: Diagnosis not present

## 2020-08-01 DIAGNOSIS — I10 Essential (primary) hypertension: Secondary | ICD-10-CM | POA: Diagnosis not present

## 2020-08-01 DIAGNOSIS — Z79899 Other long term (current) drug therapy: Secondary | ICD-10-CM | POA: Diagnosis not present

## 2020-08-01 DIAGNOSIS — M8580 Other specified disorders of bone density and structure, unspecified site: Secondary | ICD-10-CM | POA: Insufficient documentation

## 2020-08-01 DIAGNOSIS — Z923 Personal history of irradiation: Secondary | ICD-10-CM | POA: Insufficient documentation

## 2020-08-01 DIAGNOSIS — Z79810 Long term (current) use of selective estrogen receptor modulators (SERMs): Secondary | ICD-10-CM | POA: Insufficient documentation

## 2020-08-01 LAB — CBC WITH DIFFERENTIAL/PLATELET
Abs Immature Granulocytes: 0.01 10*3/uL (ref 0.00–0.07)
Basophils Absolute: 0 10*3/uL (ref 0.0–0.1)
Basophils Relative: 1 %
Eosinophils Absolute: 0.2 10*3/uL (ref 0.0–0.5)
Eosinophils Relative: 3 %
HCT: 35.2 % — ABNORMAL LOW (ref 36.0–46.0)
Hemoglobin: 12.1 g/dL (ref 12.0–15.0)
Immature Granulocytes: 0 %
Lymphocytes Relative: 37 %
Lymphs Abs: 1.9 10*3/uL (ref 0.7–4.0)
MCH: 33.5 pg (ref 26.0–34.0)
MCHC: 34.4 g/dL (ref 30.0–36.0)
MCV: 97.5 fL (ref 80.0–100.0)
Monocytes Absolute: 0.6 10*3/uL (ref 0.1–1.0)
Monocytes Relative: 12 %
Neutro Abs: 2.5 10*3/uL (ref 1.7–7.7)
Neutrophils Relative %: 47 %
Platelets: 166 10*3/uL (ref 150–400)
RBC: 3.61 MIL/uL — ABNORMAL LOW (ref 3.87–5.11)
RDW: 12.1 % (ref 11.5–15.5)
WBC: 5.3 10*3/uL (ref 4.0–10.5)
nRBC: 0 % (ref 0.0–0.2)

## 2020-08-01 LAB — COMPREHENSIVE METABOLIC PANEL
ALT: 16 U/L (ref 0–44)
AST: 22 U/L (ref 15–41)
Albumin: 3.5 g/dL (ref 3.5–5.0)
Alkaline Phosphatase: 58 U/L (ref 38–126)
Anion gap: 8 (ref 5–15)
BUN: 14 mg/dL (ref 8–23)
CO2: 27 mmol/L (ref 22–32)
Calcium: 9.1 mg/dL (ref 8.9–10.3)
Chloride: 103 mmol/L (ref 98–111)
Creatinine, Ser: 1.2 mg/dL — ABNORMAL HIGH (ref 0.44–1.00)
GFR, Estimated: 44 mL/min — ABNORMAL LOW (ref >60)
Glucose, Bld: 108 mg/dL — ABNORMAL HIGH (ref 70–99)
Potassium: 3.8 mmol/L (ref 3.5–5.1)
Sodium: 138 mmol/L (ref 135–145)
Total Bilirubin: 0.8 mg/dL (ref 0.3–1.2)
Total Protein: 6.8 g/dL (ref 6.5–8.1)

## 2020-08-02 ENCOUNTER — Telehealth: Payer: Self-pay | Admitting: Oncology

## 2020-08-02 NOTE — Telephone Encounter (Signed)
No 11/15 los. No changes made to pt's schedule.

## 2020-09-08 ENCOUNTER — Other Ambulatory Visit: Payer: Self-pay | Admitting: Oncology

## 2022-08-02 ENCOUNTER — Other Ambulatory Visit: Payer: Self-pay

## 2022-08-08 ENCOUNTER — Other Ambulatory Visit: Payer: Self-pay | Admitting: General Surgery

## 2022-08-13 ENCOUNTER — Telehealth: Payer: Self-pay | Admitting: Hematology and Oncology

## 2022-08-13 NOTE — Telephone Encounter (Signed)
Scheduled appointment per referral. Patient is aware of appointment date and time. Patient is aware to arrive 15 mins prior to appointment time and to bring updated insurance cards. Patient is aware of location.   

## 2022-08-16 ENCOUNTER — Encounter: Payer: Self-pay | Admitting: Hematology and Oncology

## 2022-08-16 ENCOUNTER — Inpatient Hospital Stay: Payer: Medicare Other | Attending: Hematology and Oncology | Admitting: Hematology and Oncology

## 2022-08-16 ENCOUNTER — Inpatient Hospital Stay: Payer: Medicare Other

## 2022-08-16 ENCOUNTER — Other Ambulatory Visit: Payer: Self-pay

## 2022-08-16 VITALS — BP 173/68 | HR 64 | Temp 97.7°F | Resp 18 | Wt 142.3 lb

## 2022-08-16 DIAGNOSIS — Z170 Estrogen receptor positive status [ER+]: Secondary | ICD-10-CM | POA: Insufficient documentation

## 2022-08-16 DIAGNOSIS — M8580 Other specified disorders of bone density and structure, unspecified site: Secondary | ICD-10-CM | POA: Insufficient documentation

## 2022-08-16 DIAGNOSIS — Z79899 Other long term (current) drug therapy: Secondary | ICD-10-CM | POA: Diagnosis not present

## 2022-08-16 DIAGNOSIS — C50312 Malignant neoplasm of lower-inner quadrant of left female breast: Secondary | ICD-10-CM | POA: Diagnosis present

## 2022-08-16 NOTE — Progress Notes (Signed)
[facility] NOTE  Patient Care Team: Tammy Seashore, MD as PCP - General (Internal Medicine) Rolm Bookbinder, MD as Consulting Physician (General Surgery) Gery Pray, MD as Consulting Physician (Radiation Oncology) Magrinat, Virgie Dad, MD (Inactive) as Consulting Physician (Oncology) Sylvan Cheese, NP as Nurse Practitioner (Hematology and Oncology)  CHIEF COMPLAINTS/PURPOSE OF CONSULTATION:  Newly diagnosed breast cancer  HISTORY OF PRESENTING ILLNESS:   Tammy Oneill 88 y.o. female is here because of recent diagnosis of right IDC.  I reviewed her records extensively and collaborated the history with the patient.  SUMMARY OF ONCOLOGIC HISTORY: Oncology History Overview Note        Malignant neoplasm of upper-inner quadrant of left breast in female, estrogen receptor positive ([place])  04/2010 Cancer Diagnosis   h/o multifocal IDC of right breast (T1c and T1b), ER/PR+, HER2/neu negative, S/P lumpectomies, radiation therapy; declined antiestrogens   05/24/2015 Mammogram   Right breast: cluster of calcifications in right breast upper outer aspect near lumpectomy site. New oval mass measuring 0.8 cm in left breast UIQ 5 cm from skin.   06/01/2015 Breast US   Left breast: 8 mm oval mass with circumscribed margin in UI aspect, middle depth, hypoechoic, correlates with mammographic findings.   06/02/2015 Initial Biopsy   Left breast core needle bx: Ductal carcinoma with extracellular mucin, ER+ (100%), PR+ (100%), HER2/neu negative (ratio 1.29), Ki67 5%   06/09/2015 Definitive Surgery   Left lumpectomy: Ductal carcinoma in situ, low grade, ER/PR positive, positive margins   06/09/2015 Pathologic Stage   Stage 0: Tis N0   08/04/2015 - 09/02/2015 Radiation Therapy   Left breast 42.72 gray in 16 fractions; lumpectomy cavity boost 10 gray in 5 fractions, cumulative dose 52.72 gray to the lumpectomy cavity   09/18/2015 -  Anti-estrogen oral therapy    Tamoxifen 20 mg daily   11/07/2015 Survivorship   Survivorship care plan completed and mailed to patient in lieu of in person visit   Malignant neoplasm of lower-inner quadrant of left breast in female, estrogen receptor positive ([place])  07/26/2022 Mammogram   Lateral diagnostic mammogram incomplete, additional imaging recommended given the partially visualized spiculated mass along the inframammary fold of the left breast.  Diagnostic mammogram and ultrasound confirmed a 1 x 1.3 x 1.1 cm irregular mass in the left breast highly suggestive of malignancy.   08/02/2022 Pathology Results   Pathology from the left breast needle core biopsy showed invasive moderately differentiated ductal carcinoma, grade 2, focal low-grade DCIS, solid type without necrosis, overall grade 2, prognostic showed ER 95% positive strong staining PR 60% positive strong staining, Ki-67 of 10% HER2 2+ by IHC and FISH negative   08/16/2022 Initial Diagnosis   Malignant neoplasm of lower-inner quadrant of left breast in female, estrogen receptor positive ([place])    Patient arrived to the appointment today with her daughter.   She is a very healthy 64 year old, has surgery scheduled for December 18.  The last 2 times she had surgery, she healed very well.  She tolerated tamoxifen very well and is willing to proceed with adjuvant antiestrogen therapy again.  Rest of the pertinent 10 point ROS reviewed and negative   MEDICAL HISTORY:  Past Medical History:  Diagnosis Date   Anxiety    Bilateral breast cancer ([place])    GERD (gastroesophageal reflux disease)    Hyperlipidemia    Hypertension    Osteopenia    on actonel   PONV (postoperative nausea and vomiting)    Radiation 08/04/15-09/02/15  left breast 42.72 gray, boosted o 52.72 gray    SURGICAL HISTORY: Past Surgical History:  Procedure Laterality Date   ABDOMINAL HYSTERECTOMY     APPENDECTOMY     BREAST LUMPECTOMY WITH RADIOACTIVE SEED LOCALIZATION Left  06/09/2015   Procedure: BREAST LUMPECTOMY WITH RADIOACTIVE SEED LOCALIZATION;  Surgeon: Rolm Bookbinder, MD;  Location: [facility];  Service: General;  Laterality: Left;   BREAST SURGERY Right 2011   lumpectomy Dr Donne Hazel    SOCIAL HISTORY: Social History   Socioeconomic History   Marital status: Divorced    Spouse name: Not on file   Number of children: 5   Years of education: Not on file   Highest education level: Not on file  Occupational History   Not on file  Tobacco Use   Smoking status: Never   Smokeless tobacco: Never  Substance and Sexual Activity   Alcohol use: No   Drug use: No   Sexual activity: Not on file  Other Topics Concern   Not on file  Social History Narrative   Not on file   Social Determinants of Health   Financial Resource Strain: Not on file  Food Insecurity: Not on file  Transportation Needs: Not on file  Physical Activity: Not on file  Stress: Not on file  Social Connections: Not on file  Intimate Partner Violence: Not on file    FAMILY HISTORY: Family History  Problem Relation Age of Onset   Lung cancer Father    Bladder Cancer Mother     ALLERGIES:  is allergic to morphine and related.  MEDICATIONS:  Current Outpatient Medications  Medication Sig Dispense Refill   atorvastatin (LIPITOR) 10 MG tablet Take 10 mg by mouth daily.  6   metoprolol succinate (TOPROL-XL) 25 MG 24 hr tablet Take 1/2 tablet daily 45 tablet 3   multivitamin-iron-minerals-folic acid (CENTRUM) chewable tablet Chew 1 tablet by mouth daily.     telmisartan-hydrochlorothiazide (MICARDIS HCT) 80-12.5 MG tablet TAKE 1 TABLET BY MOUTH EVERY DAY 30 tablet 0   vitamin B-12 (CYANOCOBALAMIN) 500 MCG tablet Take 1 tablet (500 mcg total) by mouth daily.     No current facility-administered medications for this visit.    REVIEW OF SYSTEMS:   Constitutional: Denies fevers, chills or abnormal night sweats Eyes: Denies blurriness of vision, double vision  or watery eyes Ears, nose, mouth, throat, and face: Denies mucositis or sore throat Respiratory: Denies cough, dyspnea or wheezes Cardiovascular: Denies palpitation, chest discomfort or lower extremity swelling Gastrointestinal:  Denies nausea, heartburn or change in bowel habits Skin: Denies abnormal skin rashes Lymphatics: Denies new lymphadenopathy or easy bruising Neurological:Denies numbness, tingling or new weaknesses Behavioral/Psych: Mood is stable, no new changes  Breast: She noted this to change in the left breast inframammary fold several months ago.  All other systems were reviewed with the patient and are negative.  PHYSICAL EXAMINATION: ECOG PERFORMANCE STATUS: 0 - Asymptomatic  Vitals:   08/16/22 1040  BP: (!) 173/68  Pulse: 64  Resp: 18  Temp: 97.7 F (36.5 C)  SpO2: 98%   Filed Weights   08/16/22 1040  Weight: 142 lb 5 oz (64.6 kg)    GENERAL:alert, no distress and comfortable SKIN: skin color, texture, turgor are normal, no rashes or significant lesions EYES: normal, conjunctiva are pink and non-injected, sclera clear OROPHARYNX:no exudate, no erythema and lips, buccal mucosa, and tongue normal  NECK: supple, thyroid normal size, non-tender, without nodularity LYMPH:  no palpable lymphadenopathy in the cervical, axillary  or inguinal LUNGS: clear to auscultation and percussion with normal breathing effort HEART: regular rate & rhythm and no murmurs and no lower extremity edema ABDOMEN:abdomen soft, non-tender and normal bowel sounds Musculoskeletal:no cyanosis of digits and no clubbing  PSYCH: alert & oriented x 3 with fluent speech NEURO: no focal motor/sensory deficits BREAST: Left inframammary fold indentation noted with a small lump measuring about a centimeter or centimeter and a half.  No other palpable breast changes except for postop changes.  No regional adenopathy bilaterally.  LABORATORY DATA:  I have reviewed the data as listed Lab Results   Component Value Date   WBC 5.3 08/01/2020   HGB 12.1 08/01/2020   HCT 35.2 (L) 08/01/2020   MCV 97.5 08/01/2020   PLT 166 08/01/2020   Lab Results  Component Value Date   NA 138 08/01/2020   K 3.8 08/01/2020   CL 103 08/01/2020   CO2 27 08/01/2020    RADIOGRAPHIC STUDIES: I have personally reviewed the radiological reports and agreed with the findings in the report.  ASSESSMENT AND PLAN:  Malignant neoplasm of lower-inner quadrant of left breast in female, estrogen receptor positive ([place]) This is a very pleasant 88 year old female patient with prior history of bilateral breast cancer now diagnosed with left breast lower inner quadrant invasive ductal adenocarcinoma, grade 2, ER positive PR positive, Ki-67 of 10% and HER2 negative.  She had bilateral radiation in the past.  She completed 5 years of tamoxifen in December 2021.   Given small tumor, ER positive, she will proceed with surgery and given her age and her importance to quality of life, we will not proceed with Oncotype.  We will proceed with adjuvant tamoxifen for 10 years since she has tolerated this very well.  I once again today explained this adverse effects of tamoxifen including but not limited to postmenopausal symptoms, DVT/PE, endometrial hyperplasia.  She had hysterectomy hence the endometrial complications do not pertain to her.   She had questions if she will be a candidate for reirradiation.  After surgery, we can certainly consult with Dr. Sondra Come. She will return to clinic to see me back in January 2024.   All questions were answered. The patient knows to call the clinic with any problems, questions or concerns.    Benay Pike, MD 08/16/22

## 2022-08-16 NOTE — Assessment & Plan Note (Signed)
This is a very pleasant 88 year old female patient with prior history of bilateral breast cancer now diagnosed with left breast lower inner quadrant invasive ductal adenocarcinoma, grade 2, ER positive PR positive, Ki-67 of 10% and HER2 negative.  She had bilateral radiation in the past.  She completed 5 years of tamoxifen in December 2021.   Given small tumor, ER positive, she will proceed with surgery and given her age and her importance to quality of life, we will not proceed with Oncotype.  We will proceed with adjuvant tamoxifen for 10 years since she has tolerated this very well.  I once again today explained this adverse effects of tamoxifen including but not limited to postmenopausal symptoms, DVT/PE, endometrial hyperplasia.  She had hysterectomy hence the endometrial complications do not pertain to her.   She had questions if she will be a candidate for reirradiation.  After surgery, we can certainly consult with Dr. Sondra Come. She will return to clinic to see me back in January 2024.

## 2022-08-17 ENCOUNTER — Encounter: Payer: Self-pay | Admitting: *Deleted

## 2022-08-20 ENCOUNTER — Inpatient Hospital Stay: Payer: Medicare Other | Attending: Hematology and Oncology | Admitting: Licensed Clinical Social Worker

## 2022-08-20 NOTE — Progress Notes (Signed)
[facility]  Initial Assessment   Tammy Oneill is a 88 y.o. year old female contacted by phone. Clinical Social Work was referred by new patient protocol for assessment of psychosocial needs.   SDOH (Social Determinants of Health) assessments performed: Yes SDOH Interventions    Flowsheet Row Clinical Support from 08/20/2022 in [facility]  SDOH Interventions   Food Insecurity Interventions Intervention Not Indicated  Housing Interventions Intervention Not Indicated  Transportation Interventions Intervention Not Indicated  Utilities Interventions Intervention Not Indicated  Financial Strain Interventions Intervention Not Indicated       SDOH Screenings   Food Insecurity: No Food Insecurity (08/20/2022)  Housing: Low Risk  (08/20/2022)  Transportation Needs: Unknown (08/20/2022)  Utilities: Not At Risk (08/20/2022)  Financial Resource Strain: Low Risk  (08/20/2022)  Tobacco Use: Low Risk  (08/16/2022)     Distress Screen completed: No   Family/Social Information:  Housing Arrangement: patient lives alone and has for about 40 years Family members/support persons in your life? Family (3 daughters) and Friends Transportation concerns: no, 2 of her daughters will bring her to & from her surgery  Employment: Retired .  Income source: Paediatric nurse concerns: No Type of concern: None Food access concerns: no Religious or spiritual practice:  yes Services Currently in place:    Coping/ Adjustment to diagnosis: Patient understands treatment plan and what happens next? yes, has surgery scheduled and has a plan for her daughters to help day-of and after surgery Concerns about diagnosis and/or treatment: I'm not especially worried about anything Current coping skills/ strengths: Capable of independent living , Communication skills , and Supportive family/friends     SUMMARY: Current SDOH Barriers:  None noted  today  Clinical Social Work Clinical Goal(s):  No clinical social work goals at this time  Interventions: Discussed common feeling and emotions when being diagnosed with cancer, and the importance of support during treatment Informed patient of the support team roles and support services at [facility] Provided [facility] contact information and encouraged patient to call with any questions or concerns   Follow Up Plan: Patient will contact CSW with any support or resource needs Patient verbalizes understanding of plan: Yes    Wiktoria Hemrick E Innocence Schlotzhauer, LCSW

## 2022-08-27 ENCOUNTER — Other Ambulatory Visit: Payer: Self-pay

## 2022-08-27 ENCOUNTER — Encounter (HOSPITAL_BASED_OUTPATIENT_CLINIC_OR_DEPARTMENT_OTHER): Payer: Self-pay | Admitting: General Surgery

## 2022-08-28 ENCOUNTER — Encounter (HOSPITAL_BASED_OUTPATIENT_CLINIC_OR_DEPARTMENT_OTHER)
Admission: RE | Admit: 2022-08-28 | Discharge: 2022-08-28 | Disposition: A | Discharge status: Home / Self Care | Payer: Medicare Other | Source: Ambulatory Visit | Attending: General Surgery | Admitting: General Surgery

## 2022-08-28 DIAGNOSIS — Z01818 Encounter for other preprocedural examination: Secondary | ICD-10-CM | POA: Diagnosis not present

## 2022-08-28 DIAGNOSIS — I10 Essential (primary) hypertension: Secondary | ICD-10-CM | POA: Diagnosis not present

## 2022-08-28 LAB — BASIC METABOLIC PANEL
Anion gap: 7 (ref 5–15)
BUN: 19 mg/dL (ref 8–23)
CO2: 26 mmol/L (ref 22–32)
Calcium: 9.6 mg/dL (ref 8.9–10.3)
Chloride: 107 mmol/L (ref 98–111)
Creatinine, Ser: 1.14 mg/dL — ABNORMAL HIGH (ref 0.44–1.00)
GFR, Estimated: 47 mL/min — ABNORMAL LOW (ref >60)
Glucose, Bld: 121 mg/dL — ABNORMAL HIGH (ref 70–99)
Potassium: 3.6 mmol/L (ref 3.5–5.1)
Sodium: 140 mmol/L (ref 135–145)

## 2022-08-28 MED ORDER — ENSURE PRE-SURGERY PO LIQD: 296.0000 mL | Freq: Once | ORAL | Status: DC

## 2022-08-28 NOTE — Progress Notes (Signed)

## 2022-09-02 NOTE — Anesthesia Preprocedure Evaluation (Signed)
Anesthesia Evaluation  Patient identified by MRN, date of birth, ID band Patient awake    Reviewed: Allergy & Precautions, NPO status , Patient's Chart, lab work & pertinent test results  History of Anesthesia Complications (+) PONV and history of anesthetic complications  Airway Mallampati: II  TM Distance: >3 FB Neck ROM: Full    Dental no notable dental hx. (+) Dental Advisory Given   Pulmonary neg pulmonary ROS   Pulmonary exam normal        Cardiovascular hypertension, Pt. on home beta blockers and Pt. on medications Normal cardiovascular exam     Neuro/Psych   Anxiety     negative neurological ROS     GI/Hepatic negative GI ROS, Neg liver ROS,,,  Endo/Other  negative endocrine ROS    Renal/GU negative Renal ROS     Musculoskeletal negative musculoskeletal ROS (+)    Abdominal   Peds  Hematology negative hematology ROS (+)   Anesthesia Other Findings   Reproductive/Obstetrics                             Anesthesia Physical Anesthesia Plan  ASA: 2  Anesthesia Plan: General   Post-op Pain Management: Tylenol PO (pre-op)*   Induction: Intravenous  PONV Risk Score and Plan: 4 or greater and Ondansetron, Dexamethasone, Diphenhydramine and TIVA  Airway Management Planned: LMA  Additional Equipment:   Intra-op Plan:   Post-operative Plan: Extubation in OR  Informed Consent: I have reviewed the patients History and Physical, chart, labs and discussed the procedure including the risks, benefits and alternatives for the proposed anesthesia with the patient or authorized representative who has indicated his/her understanding and acceptance.     Dental advisory given  Plan Discussed with: Anesthesiologist and CRNA  Anesthesia Plan Comments:         Anesthesia Quick Evaluation

## 2022-09-03 ENCOUNTER — Ambulatory Visit (HOSPITAL_BASED_OUTPATIENT_CLINIC_OR_DEPARTMENT_OTHER): Payer: Medicare Other | Admitting: Anesthesiology

## 2022-09-03 ENCOUNTER — Encounter (HOSPITAL_BASED_OUTPATIENT_CLINIC_OR_DEPARTMENT_OTHER): Payer: Self-pay | Admitting: General Surgery

## 2022-09-03 ENCOUNTER — Encounter (HOSPITAL_BASED_OUTPATIENT_CLINIC_OR_DEPARTMENT_OTHER)
Admission: RE | Disposition: A | Discharge status: Home / Self Care | Payer: Self-pay | Source: Ambulatory Visit | Attending: General Surgery

## 2022-09-03 ENCOUNTER — Other Ambulatory Visit: Payer: Self-pay

## 2022-09-03 ENCOUNTER — Ambulatory Visit (HOSPITAL_BASED_OUTPATIENT_CLINIC_OR_DEPARTMENT_OTHER)
Admission: RE | Admit: 2022-09-03 | Discharge: 2022-09-03 | Disposition: A | Discharge status: Home / Self Care | Payer: Medicare Other | Source: Ambulatory Visit | Attending: General Surgery | Admitting: General Surgery

## 2022-09-03 DIAGNOSIS — M7989 Other specified soft tissue disorders: Secondary | ICD-10-CM | POA: Insufficient documentation

## 2022-09-03 DIAGNOSIS — I10 Essential (primary) hypertension: Secondary | ICD-10-CM | POA: Insufficient documentation

## 2022-09-03 DIAGNOSIS — C50312 Malignant neoplasm of lower-inner quadrant of left female breast: Secondary | ICD-10-CM | POA: Diagnosis present

## 2022-09-03 DIAGNOSIS — I498 Other specified cardiac arrhythmias: Secondary | ICD-10-CM | POA: Diagnosis not present

## 2022-09-03 DIAGNOSIS — Z923 Personal history of irradiation: Secondary | ICD-10-CM | POA: Insufficient documentation

## 2022-09-03 DIAGNOSIS — F419 Anxiety disorder, unspecified: Secondary | ICD-10-CM | POA: Diagnosis not present

## 2022-09-03 DIAGNOSIS — R42 Dizziness and giddiness: Secondary | ICD-10-CM | POA: Diagnosis not present

## 2022-09-03 DIAGNOSIS — R531 Weakness: Secondary | ICD-10-CM | POA: Insufficient documentation

## 2022-09-03 DIAGNOSIS — Z170 Estrogen receptor positive status [ER+]: Secondary | ICD-10-CM

## 2022-09-03 DIAGNOSIS — C50912 Malignant neoplasm of unspecified site of left female breast: Secondary | ICD-10-CM

## 2022-09-03 HISTORY — PX: BREAST LUMPECTOMY: SHX2

## 2022-09-03 SURGERY — BREAST LUMPECTOMY
Anesthesia: General | Site: Breast | Laterality: Left

## 2022-09-03 MED ORDER — ACETAMINOPHEN 500 MG PO TABS
ORAL_TABLET | ORAL | Status: AC
  Filled 2022-09-03: qty 2

## 2022-09-03 MED ORDER — ACETAMINOPHEN 500 MG PO TABS
1000.0000 mg | ORAL_TABLET | Freq: Once | ORAL | Status: AC
  Administered 2022-09-03: 1000 mg via ORAL

## 2022-09-03 MED ORDER — CHLORHEXIDINE GLUCONATE CLOTH 2 % EX PADS: 6.0000 | MEDICATED_PAD | Freq: Once | CUTANEOUS | Status: DC

## 2022-09-03 MED ORDER — LIDOCAINE HCL (CARDIAC) PF 100 MG/5ML IV SOSY
PREFILLED_SYRINGE | INTRAVENOUS | Status: DC | PRN
  Administered 2022-09-03: 100 mg via INTRATRACHEAL

## 2022-09-03 MED ORDER — DEXAMETHASONE SODIUM PHOSPHATE 10 MG/ML IJ SOLN
INTRAMUSCULAR | Status: DC | PRN
  Administered 2022-09-03: 5 mg via INTRAVENOUS

## 2022-09-03 MED ORDER — FENTANYL CITRATE (PF) 100 MCG/2ML IJ SOLN: 25.0000 ug | INTRAMUSCULAR | Status: DC | PRN

## 2022-09-03 MED ORDER — BUPIVACAINE HCL (PF) 0.25 % IJ SOLN
INTRAMUSCULAR | Status: DC | PRN
  Administered 2022-09-03: 8 mL

## 2022-09-03 MED ORDER — ONDANSETRON HCL 4 MG/2ML IJ SOLN
INTRAMUSCULAR | Status: AC
  Filled 2022-09-03: qty 2

## 2022-09-03 MED ORDER — DIPHENHYDRAMINE HCL 50 MG/ML IJ SOLN
INTRAMUSCULAR | Status: AC
  Filled 2022-09-03: qty 1

## 2022-09-03 MED ORDER — PROMETHAZINE HCL 25 MG/ML IJ SOLN: 6.2500 mg | INTRAMUSCULAR | Status: DC | PRN

## 2022-09-03 MED ORDER — DEXAMETHASONE SODIUM PHOSPHATE 10 MG/ML IJ SOLN
INTRAMUSCULAR | Status: AC
  Filled 2022-09-03: qty 1

## 2022-09-03 MED ORDER — CEFAZOLIN SODIUM-DEXTROSE 2-4 GM/100ML-% IV SOLN
2.0000 g | INTRAVENOUS | Status: AC
  Administered 2022-09-03: 2 g via INTRAVENOUS

## 2022-09-03 MED ORDER — CEFAZOLIN SODIUM-DEXTROSE 2-4 GM/100ML-% IV SOLN
INTRAVENOUS | Status: AC
  Filled 2022-09-03: qty 100

## 2022-09-03 MED ORDER — FENTANYL CITRATE (PF) 100 MCG/2ML IJ SOLN
INTRAMUSCULAR | Status: DC | PRN
  Administered 2022-09-03: 50 ug via INTRAVENOUS
  Administered 2022-09-03: 25 ug via INTRAVENOUS

## 2022-09-03 MED ORDER — ACETAMINOPHEN 500 MG PO TABS: 1000.0000 mg | ORAL_TABLET | ORAL | Status: AC

## 2022-09-03 MED ORDER — PROPOFOL 10 MG/ML IV BOLUS
INTRAVENOUS | Status: DC | PRN
  Administered 2022-09-03: 120 mg via INTRAVENOUS

## 2022-09-03 MED ORDER — PROPOFOL 500 MG/50ML IV EMUL
INTRAVENOUS | Status: DC | PRN
  Administered 2022-09-03: 150 ug/kg/min via INTRAVENOUS

## 2022-09-03 MED ORDER — DIPHENHYDRAMINE HCL 50 MG/ML IJ SOLN
INTRAMUSCULAR | Status: DC | PRN
  Administered 2022-09-03: 12.5 mg via INTRAVENOUS

## 2022-09-03 MED ORDER — PROPOFOL 10 MG/ML IV BOLUS
INTRAVENOUS | Status: AC
  Filled 2022-09-03: qty 20

## 2022-09-03 MED ORDER — ONDANSETRON HCL 4 MG/2ML IJ SOLN
INTRAMUSCULAR | Status: DC | PRN
  Administered 2022-09-03: 4 mg via INTRAVENOUS

## 2022-09-03 MED ORDER — LACTATED RINGERS IV SOLN: INTRAVENOUS | Status: DC

## 2022-09-03 MED ORDER — OXYCODONE HCL 5 MG PO TABS
2.5000 mg | ORAL_TABLET | Freq: Once | ORAL | Status: AC
  Administered 2022-09-03: 2.5 mg via ORAL

## 2022-09-03 MED ORDER — AMISULPRIDE (ANTIEMETIC) 5 MG/2ML IV SOLN: 10.0000 mg | Freq: Once | INTRAVENOUS | Status: DC | PRN

## 2022-09-03 MED ORDER — FENTANYL CITRATE (PF) 100 MCG/2ML IJ SOLN
INTRAMUSCULAR | Status: AC
  Filled 2022-09-03: qty 2

## 2022-09-03 SURGICAL SUPPLY — 39 items
ADH SKN CLS APL DERMABOND .7 (GAUZE/BANDAGES/DRESSINGS) ×1
APPLIER CLIP 9.375 MED OPEN (MISCELLANEOUS)
APR CLP MED 9.3 20 MLT OPN (MISCELLANEOUS)
BINDER BREAST LRG (GAUZE/BANDAGES/DRESSINGS) IMPLANT
BINDER BREAST MEDIUM (GAUZE/BANDAGES/DRESSINGS) IMPLANT
BLADE SURG 15 STRL LF DISP TIS (BLADE) ×1 IMPLANT
BLADE SURG 15 STRL SS (BLADE) ×1
CANISTER SUCT 1200ML W/VALVE (MISCELLANEOUS) IMPLANT
CLIP APPLIE 9.375 MED OPEN (MISCELLANEOUS) IMPLANT
COVER BACK TABLE 60X90IN (DRAPES) ×1 IMPLANT
COVER MAYO STAND STRL (DRAPES) ×1 IMPLANT
DERMABOND ADVANCED .7 DNX12 (GAUZE/BANDAGES/DRESSINGS) IMPLANT
DRAPE LAPAROSCOPIC ABDOMINAL (DRAPES) ×1 IMPLANT
DRAPE UTILITY XL STRL (DRAPES) ×1 IMPLANT
DRSG TEGADERM 4X4.75 (GAUZE/BANDAGES/DRESSINGS) ×1 IMPLANT
ELECT COATED BLADE 2.86 ST (ELECTRODE) ×1 IMPLANT
ELECT REM PT RETURN 9FT ADLT (ELECTROSURGICAL) ×1
ELECTRODE REM PT RTRN 9FT ADLT (ELECTROSURGICAL) ×1 IMPLANT
GAUZE SPONGE 4X4 12PLY STRL LF (GAUZE/BANDAGES/DRESSINGS) ×1 IMPLANT
GLOVE BIO SURGEON STRL SZ7 (GLOVE) ×1 IMPLANT
GLOVE BIOGEL PI IND STRL 7.5 (GLOVE) ×1 IMPLANT
GOWN STRL REUS W/ TWL LRG LVL3 (GOWN DISPOSABLE) ×3 IMPLANT
GOWN STRL REUS W/TWL LRG LVL3 (GOWN DISPOSABLE) ×2
KIT MARKER MARGIN INK (KITS) ×1 IMPLANT
NDL HYPO 25X1 1.5 SAFETY (NEEDLE) ×1 IMPLANT
NEEDLE HYPO 25X1 1.5 SAFETY (NEEDLE) ×1 IMPLANT
PACK BASIN DAY SURGERY FS (CUSTOM PROCEDURE TRAY) ×1 IMPLANT
PENCIL SMOKE EVACUATOR (MISCELLANEOUS) ×1 IMPLANT
SLEEVE SCD COMPRESS KNEE MED (STOCKING) ×1 IMPLANT
SPONGE T-LAP 4X18 ~~LOC~~+RFID (SPONGE) ×1 IMPLANT
STRIP CLOSURE SKIN 1/2X4 (GAUZE/BANDAGES/DRESSINGS) IMPLANT
SUT MNCRL AB 4-0 PS2 18 (SUTURE) IMPLANT
SUT VIC AB 2-0 SH 27 (SUTURE) ×2
SUT VIC AB 2-0 SH 27XBRD (SUTURE) ×1 IMPLANT
SUT VIC AB 3-0 SH 27 (SUTURE) ×1
SUT VIC AB 3-0 SH 27X BRD (SUTURE) ×1 IMPLANT
SYR CONTROL 10ML LL (SYRINGE) ×1 IMPLANT
TOWEL GREEN STERILE FF (TOWEL DISPOSABLE) ×1 IMPLANT
TRAY FAXITRON CT DISP (TRAY / TRAY PROCEDURE) IMPLANT

## 2022-09-03 NOTE — Discharge Instructions (Addendum)
[place] Office Phone Number [phone]  POST OP INSTRUCTIONS Take 400 mg of ibuprofen every 8 hours or 650 mg tylenol every 6 hours for next 72 hours then as needed. Use ice several times daily also.  A prescription for pain medication may be given to you upon discharge.  Take your pain medication as prescribed, if needed.  If narcotic pain medicine is not needed, then you may take acetaminophen (Tylenol), naprosyn (Alleve) or ibuprofen (Advil) as needed. Take your usually prescribed medications unless otherwise directed If you need a refill on your pain medication, please contact your pharmacy.  They will contact our office to request authorization.  Prescriptions will not be filled after 5pm or on week-ends. You should eat very light the first 24 hours after surgery, such as soup, crackers, pudding, etc.  Resume your normal diet the day after surgery. Most patients will experience some swelling and bruising in the breast.  Ice packs and a good support bra will help.  Wear the breast binder provided or a sports bra for 72 hours day and night.  After that wear a sports bra during the day until you return to the office. Swelling and bruising can take several days to resolve.  It is common to experience some constipation if taking pain medication after surgery.  Increasing fluid intake and taking a stool softener will usually help or prevent this problem from occurring.  A mild laxative (Milk of Magnesia or Miralax) should be taken according to package directions if there are no bowel movements after 48 hours. I used skin glue on the incision, you may shower in 24 hours.  The glue will flake off over the next 2-3 weeks.  Any sutures or staples will be removed at the office during your follow-up visit. ACTIVITIES:  You may resume regular daily activities (gradually increasing) beginning the next day.  Wearing a good support bra or sports bra minimizes pain and swelling.  You may have  sexual intercourse when it is comfortable. You may drive when you no longer are taking prescription pain medication, you can comfortably wear a seatbelt, and you can safely maneuver your car and apply brakes. RETURN TO WORK:  ______________________________________________________________________________________ Dennis Bast should see your doctor in the office for a follow-up appointment approximately two weeks after your surgery.  Your doctor's nurse will typically make your follow-up appointment when she calls you with your pathology report.  Expect your pathology report 3-4 business days after your surgery.  You may call to check if you do not hear from [place] after three days. OTHER INSTRUCTIONS: _______________________________________________________________________________________________ _____________________________________________________________________________________________________________________________________ _____________________________________________________________________________________________________________________________________ _____________________________________________________________________________________________________________________________________  WHEN TO CALL DR WAKEFIELD: Fever over 101.0 Nausea and/or vomiting. Extreme swelling or bruising. Continued bleeding from incision. Increased pain, redness, or drainage from the incision.  The clinic staff is available to answer your questions during regular business hours.  Please don't hesitate to call and ask to speak to one of the nurses for clinical concerns.  If you have a medical emergency, go to the nearest emergency room or call 911.  A surgeon from [facility] is always on call at the hospital.  For further questions, please visit [email or web address]   No Tylenol before 1:30pm if needed.   Post Anesthesia Home Care Instructions  Activity: Get plenty of rest for the remainder of the  day. A responsible individual must stay with you for 24 hours following the procedure.  For the next 24 hours, DO NOT: -Drive a car Film/video editor -Drink  alcoholic beverages -Take any medication unless instructed by your physician -Make any legal decisions or sign important papers.  Meals: Start with liquid foods such as gelatin or soup. Progress to regular foods as tolerated. Avoid greasy, spicy, heavy foods. If nausea and/or vomiting occur, drink only clear liquids until the nausea and/or vomiting subsides. Call your physician if vomiting continues.  Special Instructions/Symptoms: Your throat may feel dry or sore from the anesthesia or the breathing tube placed in your throat during surgery. If this causes discomfort, gargle with warm salt water. The discomfort should disappear within 24 hours.

## 2022-09-03 NOTE — H&P (Signed)
88 year old female who lives alone and still remains active who has a history of a double right-sided lumpectomy in 2011 for stage Ia invasive ductal carcinoma that were ER and PR positive. She completed radiation in October 2011 and did not take adjuvant antiestrogens. In 2016 she underwent a left lumpectomy for ER/PR positive DCIS that then underwent radiotherapy on that side. She did complete 5 years of tamoxifen after that which she completed in December 2021. She then has done well. She noticed an indentation in her left breast at the inframammary fold in the lower inner portion of the breast. She underwent a mammogram and ultrasound. She has C density breast tissue there is a 1.3 cm mass deep to the indentation. This underwent a biopsy and is a grade 2 invasive ductal carcinoma that is 95% ER positive, 60% PR positive, HER2 negative, and Ki-67 is 10%. She is here with her daughter to discuss her options.  Review of Systems: A complete review of systems was obtained from the patient. I have reviewed this information and discussed as appropriate with the patient. See HPI as well for other ROS.  Review of Systems  Cardiovascular: Positive for leg swelling.  Neurological: Positive for dizziness and weakness.  All other systems reviewed and are negative.   Medical History: Past Medical History:  Diagnosis Date  Arthritis  GERD (gastroesophageal reflux disease)  History of cancer  Hyperlipidemia  Hypertension   Past Surgical History:  Procedure Laterality Date  HYSTERECTOMY  MASTECTOMY PARTIAL / LUMPECTOMY    Allergies  Allergen Reactions  Morphine Other (See Comments)  vomiting   Current Outpatient Medications on File Prior to Visit  Medication Sig Dispense Refill  atorvastatin (LIPITOR) 10 MG tablet Take 10 mg by mouth once daily  metoprolol succinate (TOPROL-XL) 25 MG XL tablet Take 12.5 mg by mouth once daily  aspirin 81 MG EC tablet Take 81 mg by mouth once daily   cyanocobalamin, vitamin B-12, (VITAMIN B-12 ORAL) Take by mouth  telmisartan-hydroCHLOROthiazide (MICARDIS HCT) 80-25 mg tablet Take 1 tablet by mouth once daily    Family History  Problem Relation Age of Onset  High blood pressure (Hypertension) Mother    Social History   Tobacco Use  Smoking Status Never  Smokeless Tobacco Never  Marital status: Divorced  Tobacco Use  Smoking status: Never  Smokeless tobacco: Never  Substance and Sexual Activity  Alcohol use: Never  Drug use: Never   Objective:    Physical Exam Vitals reviewed.  Constitutional:  Appearance: Normal appearance.  Chest:  Breasts: Right: No inverted nipple, mass or nipple discharge.  Left: Mass present. No inverted nipple or nipple discharge.  Comments: 1.5 cm mass at IM fold Lymphadenopathy:  Upper Body:  Right upper body: No supraclavicular or axillary adenopathy.  Left upper body: No supraclavicular or axillary adenopathy.  Neurological:  Mental Status: She is alert.     Assessment and Plan:   Malignant neoplasm of lower-inner quadrant of left breast in female, estrogen receptor positive    Left breast lumpectomy  We discussed the staging and pathophysiology of breast cancer. We discussed all of the different options for treatment for breast cancer including surgery, chemotherapy, radiation therapy, Herceptin, and antiestrogen therapy.  I dont think at her age with this cancer she needs a node biopsy.   We discussed the options for treatment of the breast cancer which included lumpectomy versus a mastectomy. We discussed the performance of the lumpectomy. We discussed a 5-10% chance of a positive margin  requiring reexcision in the operating room. She can likely omit radiotherapy. We discussed mastectomy and the postoperative care for that as well. Mastectomy can be followed by reconstruction. The decision for lumpectomy vs mastectomy has no impact on decision for chemotherapy. Most mastectomy  patients will not need radiation therapy. We discussed that there is no difference in her survival whether she undergoes lumpectomy with radiation therapy or antiestrogen therapy versus a mastectomy. There is also no real difference between her recurrence in the breast.  We discussed the risks of operation including bleeding, infection, possible reoperation. She understands her further therapy will be based on what her stages at the time of her operation.

## 2022-09-03 NOTE — Transfer of Care (Signed)
Immediate Anesthesia Transfer of Care Note  Patient: Tammy Oneill  Procedure(s) Performed: LEFT BREAST LUMPECTOMY (Left: Breast)  Patient Location: PACU  Anesthesia Type:General  Level of Consciousness: drowsy and patient cooperative  Airway & Oxygen Therapy: Patient Spontanous Breathing and Patient connected to face mask oxygen  Post-op Assessment: Report given to RN and Post -op Vital signs reviewed and stable  Post vital signs: Reviewed and stable  Last Vitals:  Vitals Value Taken Time  BP    Temp    Pulse 62 09/03/22 1010  Resp    SpO2 100 % 09/03/22 1010  Vitals shown include unvalidated device data.  Last Pain:  Vitals:   09/03/22 0735  TempSrc: Oral  PainSc: 4       Patients Stated Pain Goal: 4 (46/56/81 2751)  Complications: No notable events documented.

## 2022-09-03 NOTE — Anesthesia Procedure Notes (Signed)
Procedure Name: LMA Insertion Date/Time: 09/03/2022 9:32 AM  Performed by: Glory Buff, CRNAPre-anesthesia Checklist: Patient identified, Emergency Drugs available, Suction available and Patient being monitored Patient Re-evaluated:Patient Re-evaluated prior to induction Oxygen Delivery Method: Circle system utilized Preoxygenation: Pre-oxygenation with 100% oxygen Induction Type: IV induction LMA: LMA inserted LMA Size: 4.0 Number of attempts: 1 Placement Confirmation: positive ETCO2 Tube secured with: Tape

## 2022-09-03 NOTE — Interval H&P Note (Signed)
History and Physical Interval Note:  09/03/2022 9:10 AM  Tammy Oneill  has presented today for surgery, with the diagnosis of LEFT BREAST CANCER.  The various methods of treatment have been discussed with the patient and family. After consideration of risks, benefits and other options for treatment, the patient has consented to  Procedure(s): LEFT BREAST LUMPECTOMY (Left) as a surgical intervention.  The patient's history has been reviewed, patient examined, no change in status, stable for surgery.  I have reviewed the patient's chart and labs.  Questions were answered to the patient's satisfaction.     Rolm Bookbinder

## 2022-09-03 NOTE — Anesthesia Postprocedure Evaluation (Signed)
Anesthesia Post Note  Patient: Tammy Oneill  Procedure(s) Performed: LEFT BREAST LUMPECTOMY (Left: Breast)     Patient location during evaluation: PACU Anesthesia Type: General Level of consciousness: sedated Pain management: pain level controlled Vital Signs Assessment: post-procedure vital signs reviewed and stable Respiratory status: spontaneous breathing and respiratory function stable Cardiovascular status: stable Postop Assessment: no apparent nausea or vomiting Anesthetic complications: no   No notable events documented.  Last Vitals:  Vitals:   09/03/22 1040 09/03/22 1100  BP:  (!) 193/73  Pulse: 65 75  Resp: 19 16  Temp:  36.4 C  SpO2: 95% 97%    Last Pain:  Vitals:   09/03/22 1030  TempSrc:   PainSc: 2                  Varshini Arrants DANIEL

## 2022-09-03 NOTE — Op Note (Signed)
Preoperative diagnosis: Left breast cancer, clinical stage I Postoperative diagnosis: Same as above Procedure: Left breast lumpectomy Surgeon: Dr. Serita Grammes Anesthesia: General Estimated blood loss: Minimal Complications: None Drains: None Specimens: Right breast tissue containing clip marked with paint Special count was correct at completion Disposition recovery stable addition  Indications:88 year old female who lives alone and still remains active who has a history of a double right-sided lumpectomy in 2011 for stage Ia invasive ductal carcinoma that were ER and PR positive. She completed radiation in October 2011 and did not take adjuvant antiestrogens. In 2016 she underwent a left lumpectomy for ER/PR positive DCIS that then underwent radiotherapy on that side. She did complete 5 years of tamoxifen after that which she completed in December 2021. She noticed an indentation in her left breast at the inframammary fold in the lower inner portion of the breast. She underwent a mammogram and ultrasound. She has C density breast tissue there is a 1.3 cm mass deep to the indentation. This underwent a biopsy and is a grade 2 invasive ductal carcinoma that is 95% ER positive, 60% PR positive, HER2 negative, and Ki-67 is 10%. She is here with her daughter to discuss her options.We discussed proceeding with lumpectomy alone given the characteristics of the tumor.  Procedure: After informed consent was obtained she was taken to the operating room.  She was given antibiotics.  SCDs were in place.  She was then placed under general anesthesia without complication.  She was prepped and draped in standard sterile surgical fashion.  Surgical timeout was then performed.  I then made an elliptical incision around the tumor which was adherent to the skin in the inframammary fold.  I carried this out with cautery to the pectoralis muscle and remove the fascia as well I remove the tumor and a rim of normal  tissue around it and attempt to get a clear margin.  This was then marked with paint and passed off the table.  I did confirm removal of the clip with mammography.  I then obtained hemostasis.  I brought the deeper breast tissue together to the abdominal tissue with 2-0 Vicryl suture.  The skin was then closed with 3-0 Vicryl 4-0 Monocryl.  Glue insertion was applied.  She tolerated this well was extubated and transferred to recovery stable.

## 2022-09-05 ENCOUNTER — Encounter (HOSPITAL_BASED_OUTPATIENT_CLINIC_OR_DEPARTMENT_OTHER): Payer: Self-pay | Admitting: General Surgery

## 2022-09-05 LAB — SURGICAL PATHOLOGY

## 2022-09-20 ENCOUNTER — Encounter: Payer: Self-pay | Admitting: Hematology and Oncology

## 2022-09-20 ENCOUNTER — Inpatient Hospital Stay: Payer: Medicare Other | Attending: Hematology and Oncology | Admitting: Hematology and Oncology

## 2022-09-20 ENCOUNTER — Other Ambulatory Visit: Payer: Self-pay

## 2022-09-20 VITALS — BP 169/67 | HR 86 | Temp 97.9°F | Resp 16 | Ht 62.0 in | Wt 143.8 lb

## 2022-09-20 DIAGNOSIS — C50312 Malignant neoplasm of lower-inner quadrant of left female breast: Secondary | ICD-10-CM | POA: Diagnosis not present

## 2022-09-20 DIAGNOSIS — Z79810 Long term (current) use of selective estrogen receptor modulators (SERMs): Secondary | ICD-10-CM | POA: Diagnosis not present

## 2022-09-20 DIAGNOSIS — Z170 Estrogen receptor positive status [ER+]: Secondary | ICD-10-CM | POA: Diagnosis not present

## 2022-09-20 NOTE — Progress Notes (Signed)
[facility] NOTE  Patient Care Team: Merrilee Seashore, MD as PCP - General (Internal Medicine) Rolm Bookbinder, MD as Consulting Physician (General Surgery) Gery Pray, MD as Consulting Physician (Radiation Oncology) Sylvan Cheese, NP as Nurse Practitioner (Hematology and Oncology) Benay Pike, MD as Consulting Physician (Hematology and Oncology) Rockwell Germany, RN as Oncology Nurse Navigator Mauro Kaufmann, RN as Oncology Nurse Navigator  CHIEF COMPLAINTS/PURPOSE OF CONSULTATION:  Newly diagnosed breast cancer  HISTORY OF PRESENTING ILLNESS:   Tammy Oneill 89 y.o. female is here because of recent diagnosis of right IDC.  I reviewed her records extensively and collaborated the history with the patient.  SUMMARY OF ONCOLOGIC HISTORY: Oncology History Overview Note        Malignant neoplasm of upper-inner quadrant of left breast in female, estrogen receptor positive ([place])  04/2010 Cancer Diagnosis   h/o multifocal IDC of right breast (T1c and T1b), ER/PR+, HER2/neu negative, S/P lumpectomies, radiation therapy; declined antiestrogens   05/24/2015 Mammogram   Right breast: cluster of calcifications in right breast upper outer aspect near lumpectomy site. New oval mass measuring 0.8 cm in left breast UIQ 5 cm from skin.   06/01/2015 Breast US   Left breast: 8 mm oval mass with circumscribed margin in UI aspect, middle depth, hypoechoic, correlates with mammographic findings.   06/02/2015 Initial Biopsy   Left breast core needle bx: Ductal carcinoma with extracellular mucin, ER+ (100%), PR+ (100%), HER2/neu negative (ratio 1.29), Ki67 5%   06/09/2015 Definitive Surgery   Left lumpectomy: Ductal carcinoma in situ, low grade, ER/PR positive, positive margins   06/09/2015 Pathologic Stage   Stage 0: Tis N0   08/04/2015 - 09/02/2015 Radiation Therapy   Left breast 42.72 gray in 16 fractions; lumpectomy cavity boost 10 gray in 5  fractions, cumulative dose 52.72 gray to the lumpectomy cavity   09/18/2015 -  Anti-estrogen oral therapy   Tamoxifen 20 mg daily   11/07/2015 Survivorship   Survivorship care plan completed and mailed to patient in lieu of in person visit   Malignant neoplasm of lower-inner quadrant of left breast in female, estrogen receptor positive ([place])  07/26/2022 Mammogram   Lateral diagnostic mammogram incomplete, additional imaging recommended given the partially visualized spiculated mass along the inframammary fold of the left breast.  Diagnostic mammogram and ultrasound confirmed a 1 x 1.3 x 1.1 cm irregular mass in the left breast highly suggestive of malignancy.   08/02/2022 Pathology Results   Pathology from the left breast needle core biopsy showed invasive moderately differentiated ductal carcinoma, grade 2, focal low-grade DCIS, solid type without necrosis, overall grade 2, prognostic showed ER 95% positive strong staining PR 60% positive strong staining, Ki-67 of 10% HER2 2+ by IHC and FISH negative   08/16/2022 Initial Diagnosis   Malignant neoplasm of lower-inner quadrant of left breast in female, estrogen receptor positive ([place])     Ms. Tammy Oneill is here for follow-up.  Since last visit, she had lumpectomy which went really well.  She is healing very well.  No concerns at all.  She has a postop appointment with Dr. Donne Hazel tomorrow.  Rest of the pertinent 10 point ROS reviewed and negative  MEDICAL HISTORY:  Past Medical History:  Diagnosis Date   Anxiety    Bilateral breast cancer (HCC)    GERD (gastroesophageal reflux disease)    Hyperlipidemia    Hypertension    Osteopenia    on actonel   PONV (postoperative nausea and vomiting)  Radiation 08/04/15-09/02/15   left breast 42.72 gray, boosted o 52.72 gray    SURGICAL HISTORY: Past Surgical History:  Procedure Laterality Date   ABDOMINAL HYSTERECTOMY     APPENDECTOMY     BREAST LUMPECTOMY Left 09/03/2022   Procedure: LEFT  BREAST LUMPECTOMY;  Surgeon: Rolm Bookbinder, MD;  Location: [facility];  Service: General;  Laterality: Left;   BREAST LUMPECTOMY WITH RADIOACTIVE SEED LOCALIZATION Left 06/09/2015   Procedure: BREAST LUMPECTOMY WITH RADIOACTIVE SEED LOCALIZATION;  Surgeon: Rolm Bookbinder, MD;  Location: [facility];  Service: General;  Laterality: Left;   BREAST SURGERY Right 2011   lumpectomy Dr Donne Hazel    SOCIAL HISTORY: Social History   Socioeconomic History   Marital status: Widowed    Spouse name: Not on file   Number of children: 5   Years of education: Not on file   Highest education level: Not on file  Occupational History   Not on file  Tobacco Use   Smoking status: Never   Smokeless tobacco: Never  Substance and Sexual Activity   Alcohol use: No   Drug use: No   Sexual activity: Not on file  Other Topics Concern   Not on file  Social History Narrative   Not on file   Social Determinants of Health   Financial Resource Strain: Low Risk  (08/20/2022)   Overall Financial Resource Strain (CARDIA)    Difficulty of Paying Living Expenses: Not very hard  Food Insecurity: No Food Insecurity (08/20/2022)   Hunger Vital Sign    Worried About Running Out of Food in the Last Year: Never true    Ran Out of Food in the Last Year: Never true  Transportation Needs: Unknown (08/20/2022)   PRAPARE - Hydrologist (Medical): No    Lack of Transportation (Non-Medical): Not on file  Physical Activity: Not on file  Stress: Not on file  Social Connections: Not on file  Intimate Partner Violence: Not on file    FAMILY HISTORY: Family History  Problem Relation Age of Onset   Lung cancer Father    Bladder Cancer Mother     ALLERGIES:  is allergic to morphine and related.  MEDICATIONS:  Current Outpatient Medications  Medication Sig Dispense Refill   atorvastatin (LIPITOR) 10 MG tablet Take 10 mg by mouth daily.  6   Calcium  Carb-Cholecalciferol (CALCIUM + VITAMIN D3 PO) Take by mouth.     metoprolol succinate (TOPROL-XL) 25 MG 24 hr tablet Take 1/2 tablet daily 45 tablet 3   multivitamin-iron-minerals-folic acid (CENTRUM) chewable tablet Chew 1 tablet by mouth daily.     telmisartan-hydrochlorothiazide (MICARDIS HCT) 80-12.5 MG tablet TAKE 1 TABLET BY MOUTH EVERY DAY 30 tablet 0   tiZANidine (ZANAFLEX) 4 MG capsule Take 4 mg by mouth daily.     vitamin B-12 (CYANOCOBALAMIN) 500 MCG tablet Take 1 tablet (500 mcg total) by mouth daily.     No current facility-administered medications for this visit.     PHYSICAL EXAMINATION: ECOG PERFORMANCE STATUS: 0 - Asymptomatic  Vitals:   09/20/22 1147  BP: (!) 169/67  Pulse: 86  Resp: 16  Temp: 97.9 F (36.6 C)  SpO2: 98%   Filed Weights   09/20/22 1147  Weight: 143 lb 12.8 oz (65.2 kg)    GENERAL:alert, no distress and comfortable Left breast lumpectomy scar healing very well  LABORATORY DATA:  I have reviewed the data as listed Lab Results  Component Value Date  WBC 5.3 08/01/2020   HGB 12.1 08/01/2020   HCT 35.2 (L) 08/01/2020   MCV 97.5 08/01/2020   PLT 166 08/01/2020   Lab Results  Component Value Date   NA 140 08/28/2022   K 3.6 08/28/2022   CL 107 08/28/2022   CO2 26 08/28/2022    RADIOGRAPHIC STUDIES: I have personally reviewed the radiological reports and agreed with the findings in the report.  ASSESSMENT AND PLAN:  Malignant neoplasm of lower-inner quadrant of left breast in female, estrogen receptor positive ([place]) This is a very pleasant 89 year old female patient with prior history of bilateral breast cancer now diagnosed with left breast lower inner quadrant invasive ductal adenocarcinoma, grade 2, ER positive PR positive, Ki-67 of 10% and HER2 negative.  She had bilateral radiation in the past.  She completed 5 years of tamoxifen in December 2021.   Given small tumor, ER positive, she will proceed with surgery and given her  age and her importance to quality of life, we will not proceed with Oncotype.   She is here after lumpectomy, final pathology showed a 1.6 cm grade 2 IDC, close anterior margin.  We have discussed about initiating antiestrogen therapy with tamoxifen since she tolerated this really well.  I once again explained the mechanism of action, adverse effects including but not limited to postmenopausal symptoms, DVT/PE, endometrial hyperplasia and endometrial cancer in patients who have a uterus.  She understands the benefit on bone density from tamoxifen.  Have sent an in basket message to Dr. Sondra Come given her questions about role for intravenous radiation.  Once I hear back from Dr. Sondra Come, I will send tamoxifen to the pharmacy of her choice.  She will return to clinic in about 3 months. Thank you for consulting [place] in the care of this patient.  Please do not hesitate to contact us with any additional questions or concerns.  Benay Pike MD  Total time spent: 30 minutes including history, physical exam, review of records, counseling and coordination of care  All questions were answered. The patient knows to call the clinic with any problems, questions or concerns.    Benay Pike, MD 09/20/22

## 2022-09-20 NOTE — Assessment & Plan Note (Signed)
This is a very pleasant 89 year old female patient with prior history of bilateral breast cancer now diagnosed with left breast lower inner quadrant invasive ductal adenocarcinoma, grade 2, ER positive PR positive, Ki-67 of 10% and HER2 negative.  She had bilateral radiation in the past.  She completed 5 years of tamoxifen in December 2021.   Given small tumor, ER positive, she will proceed with surgery and given her age and her importance to quality of life, we will not proceed with Oncotype.   She is here after lumpectomy, final pathology showed a 1.6 cm grade 2 IDC, close anterior margin.  We have discussed about initiating antiestrogen therapy with tamoxifen since she tolerated this really well.  I once again explained the mechanism of action, adverse effects including but not limited to postmenopausal symptoms, DVT/PE, endometrial hyperplasia and endometrial cancer in patients who have a uterus.  She understands the benefit on bone density from tamoxifen.  Have sent an in basket message to Dr. Sondra Come given her questions about role for intravenous radiation.  Once I hear back from Dr. Sondra Come, I will send tamoxifen to the pharmacy of her choice.  She will return to clinic in about 3 months. Thank you for consulting [place] in the care of this patient.  Please do not hesitate to contact us with any additional questions or concerns.  Benay Pike MD

## 2022-09-25 ENCOUNTER — Encounter: Payer: Self-pay | Admitting: *Deleted

## 2022-09-25 DIAGNOSIS — C50312 Malignant neoplasm of lower-inner quadrant of left female breast: Secondary | ICD-10-CM

## 2022-09-26 ENCOUNTER — Inpatient Hospital Stay: Payer: Medicare Other | Admitting: Hematology and Oncology

## 2022-09-28 ENCOUNTER — Other Ambulatory Visit: Payer: Self-pay | Admitting: *Deleted

## 2022-10-02 ENCOUNTER — Inpatient Hospital Stay (HOSPITAL_BASED_OUTPATIENT_CLINIC_OR_DEPARTMENT_OTHER): Payer: Medicare Other | Admitting: Hematology and Oncology

## 2022-10-02 DIAGNOSIS — C50312 Malignant neoplasm of lower-inner quadrant of left female breast: Secondary | ICD-10-CM | POA: Diagnosis not present

## 2022-10-02 DIAGNOSIS — Z170 Estrogen receptor positive status [ER+]: Secondary | ICD-10-CM

## 2022-10-02 MED ORDER — TAMOXIFEN CITRATE 20 MG PO TABS: 20.0000 mg | ORAL_TABLET | Freq: Every day | ORAL | 3 refills | Status: DC

## 2022-10-02 NOTE — Assessment & Plan Note (Signed)
This is a very pleasant 89 year old female patient with prior history of bilateral breast cancer now diagnosed with left breast lower inner quadrant invasive ductal adenocarcinoma, grade 2, ER positive PR positive, Ki-67 of 10% and HER2 negative.  She had bilateral radiation in the past.  She completed 5 years of tamoxifen in December 2021.   Given small tumor, ER positive, she will proceed with surgery and given her age and her importance to quality of life, we will not proceed with Oncotype.   She is here after lumpectomy, final pathology showed a 1.6 cm grade 2 IDC, close anterior margin.   We discussed about starting tamoxifen. I reviewed mechanism of action, adverse effects including but not limited to DVT/PE, post menopausal symptoms and benefit on bone density. I reviewed symptoms and signs of DVT/PE. RTC in 3 months for follow up.  Thank you for consulting [place] in the care of this patient.  Please do not hesitate to contact us with any additional questions or concerns.  Benay Pike MD

## 2022-10-02 NOTE — Progress Notes (Signed)
[facility] NOTE  Patient Care Team: Merrilee Seashore, MD as PCP - General (Internal Medicine) Rolm Bookbinder, MD as Consulting Physician (General Surgery) Gery Pray, MD as Consulting Physician (Radiation Oncology) Sylvan Cheese, NP as Nurse Practitioner (Hematology and Oncology) Benay Pike, MD as Consulting Physician (Hematology and Oncology) Rockwell Germany, RN as Oncology Nurse Navigator Mauro Kaufmann, RN as Oncology Nurse Navigator  CHIEF COMPLAINTS/PURPOSE OF CONSULTATION:  Newly diagnosed breast cancer  HISTORY OF PRESENTING ILLNESS:   Tammy Oneill 89 y.o. female is here because of recent diagnosis of right IDC.  I reviewed her records extensively and collaborated the history with the patient.  SUMMARY OF ONCOLOGIC HISTORY: Oncology History Overview Note        Malignant neoplasm of upper-inner quadrant of left breast in female, estrogen receptor positive ([place])  04/2010 Cancer Diagnosis   h/o multifocal IDC of right breast (T1c and T1b), ER/PR+, HER2/neu negative, S/P lumpectomies, radiation therapy; declined antiestrogens   05/24/2015 Mammogram   Right breast: cluster of calcifications in right breast upper outer aspect near lumpectomy site. New oval mass measuring 0.8 cm in left breast UIQ 5 cm from skin.   06/01/2015 Breast US   Left breast: 8 mm oval mass with circumscribed margin in UI aspect, middle depth, hypoechoic, correlates with mammographic findings.   06/02/2015 Initial Biopsy   Left breast core needle bx: Ductal carcinoma with extracellular mucin, ER+ (100%), PR+ (100%), HER2/neu negative (ratio 1.29), Ki67 5%   06/09/2015 Definitive Surgery   Left lumpectomy: Ductal carcinoma in situ, low grade, ER/PR positive, positive margins   06/09/2015 Pathologic Stage   Stage 0: Tis N0   08/04/2015 - 09/02/2015 Radiation Therapy   Left breast 42.72 gray in 16 fractions; lumpectomy cavity boost 10 gray in 5  fractions, cumulative dose 52.72 gray to the lumpectomy cavity   09/18/2015 -  Anti-estrogen oral therapy   Tamoxifen 20 mg daily   11/07/2015 Survivorship   Survivorship care plan completed and mailed to patient in lieu of in person visit   Malignant neoplasm of lower-inner quadrant of left breast in female, estrogen receptor positive ([place])  07/26/2022 Mammogram   Lateral diagnostic mammogram incomplete, additional imaging recommended given the partially visualized spiculated mass along the inframammary fold of the left breast.  Diagnostic mammogram and ultrasound confirmed a 1 x 1.3 x 1.1 cm irregular mass in the left breast highly suggestive of malignancy.   08/02/2022 Pathology Results   Pathology from the left breast needle core biopsy showed invasive moderately differentiated ductal carcinoma, grade 2, focal low-grade DCIS, solid type without necrosis, overall grade 2, prognostic showed ER 95% positive strong staining PR 60% positive strong staining, Ki-67 of 10% HER2 2+ by IHC and FISH negative   08/16/2022 Initial Diagnosis   Malignant neoplasm of lower-inner quadrant of left breast in female, estrogen receptor positive ([place])     Tammy Oneill is here for telephone follow up. Recently she had HCTZ uptitrated to 25 mg daily. She otherwise has healed well from the breast surgery. She is keen to go back on Tamoxifen.   MEDICAL HISTORY:  Past Medical History:  Diagnosis Date   Anxiety    Bilateral breast cancer ([place])    GERD (gastroesophageal reflux disease)    Hyperlipidemia    Hypertension    Osteopenia    on actonel   PONV (postoperative nausea and vomiting)    Radiation 08/04/15-09/02/15   left breast 42.72 gray, boosted o 52.72 gray  SURGICAL HISTORY: Past Surgical History:  Procedure Laterality Date   ABDOMINAL HYSTERECTOMY     APPENDECTOMY     BREAST LUMPECTOMY Left 09/03/2022   Procedure: LEFT BREAST LUMPECTOMY;  Surgeon: Rolm Bookbinder, MD;  Location: [facility];  Service: General;  Laterality: Left;   BREAST LUMPECTOMY WITH RADIOACTIVE SEED LOCALIZATION Left 06/09/2015   Procedure: BREAST LUMPECTOMY WITH RADIOACTIVE SEED LOCALIZATION;  Surgeon: Rolm Bookbinder, MD;  Location: [facility];  Service: General;  Laterality: Left;   BREAST SURGERY Right 2011   lumpectomy Dr Donne Hazel    SOCIAL HISTORY: Social History   Socioeconomic History   Marital status: Widowed    Spouse name: Not on file   Number of children: 5   Years of education: Not on file   Highest education level: Not on file  Occupational History   Not on file  Tobacco Use   Smoking status: Never   Smokeless tobacco: Never  Substance and Sexual Activity   Alcohol use: No   Drug use: No   Sexual activity: Not on file  Other Topics Concern   Not on file  Social History Narrative   Not on file   Social Determinants of Health   Financial Resource Strain: Low Risk  (08/20/2022)   Overall Financial Resource Strain (CARDIA)    Difficulty of Paying Living Expenses: Not very hard  Food Insecurity: No Food Insecurity (08/20/2022)   Hunger Vital Sign    Worried About Running Out of Food in the Last Year: Never true    Ran Out of Food in the Last Year: Never true  Transportation Needs: Unknown (08/20/2022)   PRAPARE - Hydrologist (Medical): No    Lack of Transportation (Non-Medical): Not on file  Physical Activity: Not on file  Stress: Not on file  Social Connections: Not on file  Intimate Partner Violence: Not on file    FAMILY HISTORY: Family History  Problem Relation Age of Onset   Lung cancer Father    Bladder Cancer Mother     ALLERGIES:  is allergic to morphine and related.  MEDICATIONS:  Current Outpatient Medications  Medication Sig Dispense Refill   tamoxifen (NOLVADEX) 20 MG tablet Take 1 tablet (20 mg total) by mouth daily. 90 tablet 3   atorvastatin (LIPITOR) 10 MG tablet Take 10 mg by mouth daily.   6   Calcium Carb-Cholecalciferol (CALCIUM + VITAMIN D3 PO) Take by mouth.     metoprolol succinate (TOPROL-XL) 25 MG 24 hr tablet Take 1/2 tablet daily 45 tablet 3   multivitamin-iron-minerals-folic acid (CENTRUM) chewable tablet Chew 1 tablet by mouth daily.     telmisartan-hydrochlorothiazide (MICARDIS HCT) 80-12.5 MG tablet TAKE 1 TABLET BY MOUTH EVERY DAY 30 tablet 0   tiZANidine (ZANAFLEX) 4 MG capsule Take 4 mg by mouth daily.     vitamin B-12 (CYANOCOBALAMIN) 500 MCG tablet Take 1 tablet (500 mcg total) by mouth daily.     No current facility-administered medications for this visit.     PHYSICAL EXAMINATION: ECOG PERFORMANCE STATUS: 0 - Asymptomatic  There were no vitals filed for this visit.  There were no vitals filed for this visit.  PE not done, telephone visit.  LABORATORY DATA:  I have reviewed the data as listed Lab Results  Component Value Date   WBC 5.3 08/01/2020   HGB 12.1 08/01/2020   HCT 35.2 (L) 08/01/2020   MCV 97.5 08/01/2020   PLT 166 08/01/2020   Lab  Results  Component Value Date   NA 140 08/28/2022   K 3.6 08/28/2022   CL 107 08/28/2022   CO2 26 08/28/2022    RADIOGRAPHIC STUDIES: I have personally reviewed the radiological reports and agreed with the findings in the report.  ASSESSMENT AND PLAN:  Malignant neoplasm of lower-inner quadrant of left breast in female, estrogen receptor positive ([place]) This is a very pleasant 89 year old female patient with prior history of bilateral breast cancer now diagnosed with left breast lower inner quadrant invasive ductal adenocarcinoma, grade 2, ER positive PR positive, Ki-67 of 10% and HER2 negative.  She had bilateral radiation in the past.  She completed 5 years of tamoxifen in December 2021.   Given small tumor, ER positive, she will proceed with surgery and given her age and her importance to quality of life, we will not proceed with Oncotype.   She is here after lumpectomy, final pathology showed a  1.6 cm grade 2 IDC, close anterior margin.   We discussed about starting tamoxifen. I reviewed mechanism of action, adverse effects including but not limited to DVT/PE, post menopausal symptoms and benefit on bone density. I reviewed symptoms and signs of DVT/PE. RTC in 3 months for follow up.  Thank you for consulting [place] in the care of this patient.  Please do not hesitate to contact us with any additional questions or concerns.  Benay Pike MD  I connected with  Tammy Oneill on 10/02/22 by a telephone application and verified that I am speaking with the correct person using two identifiers.   I discussed the limitations of evaluation and management by telemedicine. The patient expressed understanding and agreed to proceed.  Total time spent: 12 min  All questions were answered. The patient knows to call the clinic with any problems, questions or concerns.    Benay Pike, MD 10/02/22

## 2022-12-03 ENCOUNTER — Ambulatory Visit: Payer: Medicare Other | Admitting: Hematology and Oncology

## 2022-12-31 ENCOUNTER — Ambulatory Visit (HOSPITAL_BASED_OUTPATIENT_CLINIC_OR_DEPARTMENT_OTHER)
Admission: RE | Admit: 2022-12-31 | Discharge: 2022-12-31 | Disposition: A | Discharge status: Home / Self Care | Payer: Medicare Other | Source: Ambulatory Visit | Attending: Adult Health | Admitting: Adult Health

## 2022-12-31 ENCOUNTER — Other Ambulatory Visit: Payer: Self-pay

## 2022-12-31 ENCOUNTER — Encounter: Payer: Self-pay | Admitting: Adult Health

## 2022-12-31 ENCOUNTER — Inpatient Hospital Stay: Payer: Medicare Other | Attending: Hematology and Oncology | Admitting: Adult Health

## 2022-12-31 VITALS — BP 152/45 | HR 67 | Temp 97.9°F | Resp 16 | Ht 62.0 in | Wt 143.6 lb

## 2022-12-31 DIAGNOSIS — C50312 Malignant neoplasm of lower-inner quadrant of left female breast: Secondary | ICD-10-CM

## 2022-12-31 DIAGNOSIS — M7989 Other specified soft tissue disorders: Secondary | ICD-10-CM | POA: Insufficient documentation

## 2022-12-31 DIAGNOSIS — N63 Unspecified lump in unspecified breast: Secondary | ICD-10-CM

## 2022-12-31 DIAGNOSIS — Z170 Estrogen receptor positive status [ER+]: Secondary | ICD-10-CM

## 2022-12-31 DIAGNOSIS — Z79810 Long term (current) use of selective estrogen receptor modulators (SERMs): Secondary | ICD-10-CM | POA: Diagnosis not present

## 2022-12-31 NOTE — Progress Notes (Signed)
Lower extremity venous bilateral study completed.   Please see CV Proc for preliminary results.   Daevon Holdren, RDMS, RVT  

## 2022-12-31 NOTE — Progress Notes (Signed)
SURVIVORSHIPVISIT:  BRIEF ONCOLOGIC HISTORY:  Oncology History Overview Note        Malignant neoplasm of upper-inner quadrant of left breast in female, estrogen receptor positive  04/2010 Cancer Diagnosis   h/o multifocal IDC of right breast (T1c and T1b), ER/PR+, HER2/neu negative, S/P lumpectomies, radiation therapy; declined antiestrogens   05/24/2015 Mammogram   Right breast: cluster of calcifications in right breast upper outer aspect near lumpectomy site. New oval mass measuring 0.8 cm in left breast UIQ 5 cm from skin.   06/01/2015 Breast US   Left breast: 8 mm oval mass with circumscribed margin in UI aspect, middle depth, hypoechoic, correlates with mammographic findings.   06/02/2015 Initial Biopsy   Left breast core needle bx: Ductal carcinoma with extracellular mucin, ER+ (100%), PR+ (100%), HER2/neu negative (ratio 1.29), Ki67 5%   06/09/2015 Definitive Surgery   Left lumpectomy: Ductal carcinoma in situ, low grade, ER/PR positive, positive margins   06/09/2015 Pathologic Stage   Stage 0: Tis N0   08/04/2015 - 09/02/2015 Radiation Therapy   Left breast 42.72 gray in 16 fractions; lumpectomy cavity boost 10 gray in 5 fractions, cumulative dose 52.72 gray to the lumpectomy cavity   09/18/2015 -  Anti-estrogen oral therapy   Tamoxifen 20 mg daily   11/07/2015 Survivorship   Survivorship care plan completed and mailed to patient in lieu of in person visit   09/2022 -  Anti-estrogen oral therapy   Tamoxifen   Malignant neoplasm of lower-inner quadrant of left breast in female, estrogen receptor positive  07/26/2022 Mammogram   Lateral diagnostic mammogram incomplete, additional imaging recommended given the partially visualized spiculated mass along the inframammary fold of the left breast.  Diagnostic mammogram and ultrasound confirmed a 1 x 1.3 x 1.1 cm irregular mass in the left breast highly suggestive of malignancy.   08/02/2022 Pathology Results   Pathology from the  left breast needle core biopsy showed invasive moderately differentiated ductal carcinoma, grade 2, focal low-grade DCIS, solid type without necrosis, overall grade 2, prognostic showed ER 95% positive strong staining PR 60% positive strong staining, Ki-67 of 10% HER2 2+ by IHC and FISH negative   08/16/2022 Initial Diagnosis   Malignant neoplasm of lower-inner quadrant of left breast in female, estrogen receptor positive (HCC)   09/2022 -  Anti-estrogen oral therapy   Tamoxifen     INTERVAL HISTORY:  Tammy Oneill to review her survivorship care plan detailing her treatment course for breast cancer, as well as monitoring long-term side effects of that treatment, education regarding health maintenance, screening, and overall wellness and health promotion.     Overall, Tammy Oneill reports feeling quite well. She is taking Tamoxifen daily.  She notes that her left leg swells quite a bit.  She says for 2-3 months the leg has been swelling.  She notes that this is worse as the day progresses.    REVIEW OF SYSTEMS:  Review of Systems  Constitutional:  Negative for appetite change, chills, fatigue, fever and unexpected weight change.  HENT:   Negative for hearing loss, lump/mass and trouble swallowing.   Eyes:  Negative for eye problems and icterus.  Respiratory:  Negative for chest tightness, cough and shortness of breath.   Cardiovascular:  Negative for chest pain, leg swelling and palpitations.  Gastrointestinal:  Negative for abdominal distention, abdominal pain, constipation, diarrhea, nausea and vomiting.  Endocrine: Negative for hot flashes.  Genitourinary:  Negative for difficulty urinating.   Musculoskeletal:  Negative for arthralgias.  Skin:  Negative  for itching and rash.  Neurological:  Negative for dizziness, extremity weakness, headaches and numbness.  Hematological:  Negative for adenopathy. Does not bruise/bleed easily.  Psychiatric/Behavioral:  Negative for depression. The patient is  not nervous/anxious.    Breast: Denies any new nodularity, masses, tenderness, nipple changes, or nipple discharge.       PAST MEDICAL/SURGICAL HISTORY:  Past Medical History:  Diagnosis Date   Anxiety    Bilateral breast cancer (HCC)    GERD (gastroesophageal reflux disease)    Hyperlipidemia    Hypertension    Osteopenia    on actonel   PONV (postoperative nausea and vomiting)    Radiation 08/04/15-09/02/15   left breast 42.72 gray, boosted o 52.72 gray   Past Surgical History:  Procedure Laterality Date   ABDOMINAL HYSTERECTOMY     APPENDECTOMY     BREAST LUMPECTOMY Left 09/03/2022   Procedure: LEFT BREAST LUMPECTOMY;  Surgeon: Emelia Loron, MD;  Location: [facility];  Service: General;  Laterality: Left;   BREAST LUMPECTOMY WITH RADIOACTIVE SEED LOCALIZATION Left 06/09/2015   Procedure: BREAST LUMPECTOMY WITH RADIOACTIVE SEED LOCALIZATION;  Surgeon: Emelia Loron, MD;  Location: [facility];  Service: General;  Laterality: Left;   BREAST SURGERY Right 2011   lumpectomy Dr Dwain Sarna     ALLERGIES:  Allergies  Allergen Reactions   Morphine And Related     vomiting     CURRENT MEDICATIONS:  Outpatient Encounter Medications as of 12/31/2022  Medication Sig Note   atorvastatin (LIPITOR) 10 MG tablet Take 10 mg by mouth daily. 06/20/2015: Received from: External Pharmacy   Calcium Carb-Cholecalciferol (CALCIUM + VITAMIN D3 PO) Take by mouth.    metoprolol succinate (TOPROL-XL) 25 MG 24 hr tablet Take 1/2 tablet daily    multivitamin-iron-minerals-folic acid (CENTRUM) chewable tablet Chew 1 tablet by mouth daily.    tamoxifen (NOLVADEX) 20 MG tablet Take 1 tablet (20 mg total) by mouth daily.    telmisartan-hydrochlorothiazide (MICARDIS HCT) 80-12.5 MG tablet TAKE 1 TABLET BY MOUTH EVERY DAY    tiZANidine (ZANAFLEX) 4 MG capsule Take 4 mg by mouth daily.    vitamin B-12 (CYANOCOBALAMIN) 500 MCG tablet Take 1 tablet (500 mcg total)  by mouth daily.    No facility-administered encounter medications on file as of 12/31/2022.     ONCOLOGIC FAMILY HISTORY:  Family History  Problem Relation Age of Onset   Lung cancer Father    Bladder Cancer Mother     SOCIAL HISTORY:  Social History   Socioeconomic History   Marital status: Widowed    Spouse name: Not on file   Number of children: 5   Years of education: Not on file   Highest education level: Not on file  Occupational History   Not on file  Tobacco Use   Smoking status: Never   Smokeless tobacco: Never  Substance and Sexual Activity   Alcohol use: No   Drug use: No   Sexual activity: Not on file  Other Topics Concern   Not on file  Social History Narrative   Not on file   Social Determinants of Health   Financial Resource Strain: Low Risk  (08/20/2022)   Overall Financial Resource Strain (CARDIA)    Difficulty of Paying Living Expenses: Not very hard  Food Insecurity: No Food Insecurity (08/20/2022)   Hunger Vital Sign    Worried About Running Out of Food in the Last Year: Never true    Ran Out of Food in the Last  Year: Never true  Transportation Needs: Unknown (08/20/2022)   PRAPARE - Administrator, Civil Service (Medical): No    Lack of Transportation (Non-Medical): Not on file  Physical Activity: Not on file  Stress: Not on file  Social Connections: Not on file  Intimate Partner Violence: Not on file     OBSERVATIONS/OBJECTIVE:  There were no vitals taken for this visit. GENERAL: Patient is a well appearing female in no acute distress HEENT:  Sclerae anicteric.  Oropharynx clear and moist. No ulcerations or evidence of oropharyngeal candidiasis. Neck is supple.  NODES:  No cervical, supraclavicular, or axillary lymphadenopathy palpated.  BREAST EXAM:  left breast s/p lumpectomy, no sign of local recurrence, right s/p lumpectomy, thickness noted at 12 oclock (new per patient) LUNGS:  Clear to auscultation bilaterally.  No  wheezes or rhonchi. HEART:  Regular rate and rhythm. No murmur appreciated. ABDOMEN:  Soft, nontender.  Positive, normoactive bowel sounds. No organomegaly palpated. MSK:  No focal spinal tenderness to palpation. Full range of motion bilaterally in the upper extremities. EXTREMITIES:  No peripheral edema.   SKIN:  Clear with no obvious rashes or skin changes. No nail dyscrasia. NEURO:  Nonfocal. Well oriented.  Appropriate affect.  LABORATORY DATA:  None for this visit.  DIAGNOSTIC IMAGING:  None for this visit.      ASSESSMENT AND PLAN:  Ms.. Oneill is a pleasant 89 y.o. female with Stage IA right/left breast invasive ductal carcinoma, ER+/PR+/HER2-, diagnosed in 07/2022, treated with lumpectomy and anti-estrogen therapy with Tamoxifen beginning in 09/2022.  She presents to the Survivorship Clinic for our initial meeting and routine follow-up post-completion of treatment for breast cancer.    1. Stage IA left breast cancer:  Tammy Oneill is continuing to recover from definitive treatment for breast cancer. She will follow-up with her medical oncologist, Dr. Al Pimple in 6 months with history and physical exam per surveillance protocol.  She will continue her anti-estrogen therapy with Tamoxifen. Thus far, she is tolerating the Tamoxifen well, with minimal side effects. Her mammogram is due 07/2023; orders placed today. Today, a comprehensive survivorship care plan and treatment summary was reviewed with the patient today detailing her breast cancer diagnosis, treatment course, potential late/long-term effects of treatment, appropriate follow-up care with recommendations for the future, and patient education resources.  A copy of this summary, along with a letter will be sent to the patient's primary care provider via mail/fax/In Basket message after today's visit.    2. Bilateral lower ext swelling, left worse than right: doppler orders placed to r/o DVT  3. Right breast thickness/change: diagnostic  mammogram and ultrasound ordered.  4. Bone health:  She was given education on specific activities to promote bone health.  5. Cancer screening:  Due to Tammy Oneill history and her age, she should receive screening for skin cancers, colon cancer, and gynecologic cancers.  The information and recommendations are listed on the patient's comprehensive care plan/treatment summary and were reviewed in detail with the patient.    6. Health maintenance and wellness promotion: Tammy Oneill was encouraged to consume 5-7 servings of fruits and vegetables per day. We reviewed the "Nutrition Rainbow" handout.  She was also encouraged to engage in moderate to vigorous exercise for 30 minutes per day most days of the week as she is able (she is 89 years old). She was instructed to limit her alcohol consumption and continue to abstain from tobacco use.     7. Support services/counseling: It is not  uncommon for this period of the patient's cancer care trajectory to be one of many emotions and stressors. She was given information regarding our available services and encouraged to contact me with any questions or for help enrolling in any of our support group/programs.    Follow up instructions:    -Return to cancer center in 6 months for f/u with Dr. Al Pimple  -Mammogram due in 07/2023 -She is welcome to return back to the Survivorship Clinic at any time; no additional follow-up needed at this time.  -Consider referral back to survivorship as a long-term survivor for continued surveillance  The patient was provided an opportunity to ask questions and all were answered. The patient agreed with the plan and demonstrated an understanding of the instructions.   Total encounter time:50 minutes*in face-to-face visit time, chart review, lab review, care coordination, order entry, and documentation of the encounter time.    Lillard Anes, NP 12/31/22 11:00 AM Medical Oncology and Hematology [facility] [address] [place], [place] [address] Tel. [phone]    Fax. [phone]  *Total Encounter Time as defined by the Centers for Medicare and Medicaid Services includes, in addition to the face-to-face time of a patient visit (documented in the note above) non-face-to-face time: obtaining and reviewing outside history, ordering and reviewing medications, tests or procedures, care coordination (communications with other health care professionals or caregivers) and documentation in the medical record.

## 2023-01-01 ENCOUNTER — Encounter: Payer: Self-pay | Admitting: Hematology and Oncology

## 2023-02-05 ENCOUNTER — Other Ambulatory Visit: Payer: Self-pay

## 2023-02-05 DIAGNOSIS — Z170 Estrogen receptor positive status [ER+]: Secondary | ICD-10-CM

## 2023-02-05 NOTE — Progress Notes (Signed)
Yearly diagnostic mammogram ordered per [facility] recommendation. Order faxed to [phone] with receipt confirmation

## 2023-02-06 ENCOUNTER — Encounter: Payer: Self-pay | Admitting: Hematology and Oncology

## 2023-07-13 ENCOUNTER — Ambulatory Visit: Payer: Medicare Other

## 2023-07-13 ENCOUNTER — Ambulatory Visit
Admission: RE | Admit: 2023-07-13 | Discharge: 2023-07-13 | Disposition: A | Discharge status: Home / Self Care | Payer: Medicare Other | Source: Ambulatory Visit | Attending: Family Medicine | Admitting: Family Medicine

## 2023-07-13 VITALS — BP 176/79 | HR 62 | Temp 98.0°F | Resp 16

## 2023-07-13 DIAGNOSIS — M25572 Pain in left ankle and joints of left foot: Secondary | ICD-10-CM

## 2023-07-13 MED ORDER — PREDNISONE 20 MG PO TABS: 20.0000 mg | ORAL_TABLET | Freq: Two times a day (BID) | ORAL | 0 refills | Status: DC

## 2023-07-13 MED ORDER — TRAMADOL HCL 50 MG PO TABS: 50.0000 mg | ORAL_TABLET | Freq: Four times a day (QID) | ORAL | 0 refills | Status: DC | PRN

## 2023-07-13 NOTE — ED Triage Notes (Signed)
Pt presents with c/o lt ankle pain x 1 wk. Pt states today is the worst her pain has been. Pt denies injuries, denies falls in the last 3 months. States the pain is random. Painful to apply weight.   Has used ice packs and has not had relief.   Home interventions: Voltaren gel and Ibuprofen

## 2023-07-13 NOTE — ED Provider Notes (Signed)
[facility]    CSN: [id number] Arrival date & time: 07/13/23  1124      History   Chief Complaint Chief Complaint  Patient presents with   Ankle Pain    Have been experiencing pain for over a week but now it is excruciating - Entered by patient    HPI Tammy Oneill is a 89 y.o. female.   HPI  Patient states she has ankle pain.  It has been going off and on for a week, but is progressively worsening.  Today she describes it she has had some increased activity and walking, shopping at [employer] and standing for long peers of time to bake.  Has had no trip or injury no fall.  No change in shoewear.  She has arthritis in other joints but does not have known arthritis in her ankle.  Has never had gout.  Past Medical History:  Diagnosis Date   Anxiety    Bilateral breast cancer (HCC)    GERD (gastroesophageal reflux disease)    Hyperlipidemia    Hypertension    Osteopenia    on actonel   PONV (postoperative nausea and vomiting)    Radiation 08/04/15-09/02/15   left breast 42.72 gray, boosted o 52.72 gray    Patient Active Problem List   Diagnosis Date Noted   Malignant neoplasm of lower-inner quadrant of left breast in female, estrogen receptor positive (HCC) 08/16/2022   Malignant neoplasm of overlapping sites of right breast in female, estrogen receptor positive (HCC) 06/14/2016   Malignant neoplasm of upper-inner quadrant of left breast in female, estrogen receptor positive (HCC) 07/20/2015   Genetic testing 06/08/2015   Sinus bradycardia 04/10/2013   Pure hypercholesterolemia 06/21/2011   Benign hypertensive heart disease without heart failure 06/21/2011    Past Surgical History:  Procedure Laterality Date   ABDOMINAL HYSTERECTOMY     APPENDECTOMY     BREAST LUMPECTOMY Left 09/03/2022   Procedure: LEFT BREAST LUMPECTOMY;  Surgeon: Emelia Loron, MD;  Location: [facility];  Service: General;  Laterality: Left;   BREAST LUMPECTOMY WITH  RADIOACTIVE SEED LOCALIZATION Left 06/09/2015   Procedure: BREAST LUMPECTOMY WITH RADIOACTIVE SEED LOCALIZATION;  Surgeon: Emelia Loron, MD;  Location: [facility];  Service: General;  Laterality: Left;   BREAST SURGERY Right 2011   lumpectomy Dr Dwain Sarna    OB History   No obstetric history on file.      Home Medications    Prior to Admission medications   Medication Sig Start Date End Date Taking? Authorizing Provider  predniSONE (DELTASONE) 20 MG tablet Take 1 tablet (20 mg total) by mouth 2 (two) times daily with a meal. 07/13/23  Yes Eustace Moore, MD  traMADol (ULTRAM) 50 MG tablet Take 1 tablet (50 mg total) by mouth every 6 (six) hours as needed. 07/13/23  Yes Eustace Moore, MD  amLODipine (NORVASC) 2.5 MG tablet Take 2.5 mg by mouth daily.    [provider]  atorvastatin (LIPITOR) 10 MG tablet Take 10 mg by mouth daily. 06/06/15   [provider]  Calcium Carb-Cholecalciferol (CALCIUM + VITAMIN D3 PO) Take by mouth.    [provider]  denosumab (PROLIA) 60 MG/ML SOSY injection 60 mg every 6 (six) months.    [provider]  hydrochlorothiazide (MICROZIDE) 12.5 MG capsule Take 12.5 mg by mouth daily.    [provider]  meloxicam (MOBIC) 7.5 MG tablet Take 7.5 mg by mouth daily.    [provider]  metoprolol succinate (TOPROL-XL) 25 MG 24 hr tablet Take 1/2 tablet daily 04/06/14   Cassell Clement, MD  tamoxifen (NOLVADEX) 20 MG tablet Take 1 tablet (20 mg total) by mouth daily. 10/02/22   Rachel Moulds, MD  telmisartan-hydrochlorothiazide (MICARDIS HCT) 80-12.5 MG tablet TAKE 1 TABLET BY MOUTH EVERY DAY 07/26/15   Cassell Clement, MD  tiZANidine (ZANAFLEX) 4 MG capsule Take 4 mg by mouth daily.    [provider]    Family History Family History  Problem Relation Age of Onset   Bladder Cancer Mother    Lung cancer Father    Breast cancer Daughter    Breast cancer Daughter      Social History Social History   Tobacco Use   Smoking status: Never   Smokeless tobacco: Never  Substance Use Topics   Alcohol use: No   Drug use: No     Allergies   Morphine and codeine and Morphine sulfate   Review of Systems Review of Systems See HPI  Physical Exam Triage Vital Signs ED Triage Vitals  Encounter Vitals Group     BP 07/13/23 1142 (!) 176/79     Systolic BP Percentile --      Diastolic BP Percentile --      Pulse Rate 07/13/23 1142 62     Resp 07/13/23 1142 16     Temp 07/13/23 1142 98 F (36.7 C)     Temp Source 07/13/23 1142 Oral     SpO2 07/13/23 1142 97 %     Weight --      Height --      Head Circumference --      Peak Flow --      Pain Score 07/13/23 1141 9     Pain Loc --      Pain Education --      Exclude from Growth Chart --    No data found.  Updated Vital Signs BP (!) 176/79 (BP Location: Left Arm)   Pulse 62   Temp 98 F (36.7 C) (Oral)   Resp 16   SpO2 97%    Physical Exam Constitutional:      General: She is not in acute distress.    Appearance: She is well-developed and normal weight.  HENT:     Head: Normocephalic and atraumatic.  Eyes:     Conjunctiva/sclera: Conjunctivae normal.     Pupils: Pupils are equal, round, and reactive to light.  Cardiovascular:     Rate and Rhythm: Normal rate.  Pulmonary:     Effort: Pulmonary effort is normal. No respiratory distress.  Abdominal:     General: There is no distension.     Palpations: Abdomen is soft.  Musculoskeletal:        General: Swelling and tenderness present. Normal range of motion.     Cervical back: Normal range of motion.     Comments: Ankle has slight soft tissue swelling anterior and lateral.  No tenderness over the lateral medial malleolus, no tenderness over the heel or Achilles tendon insertion.  There is moderate tenderness to palpation of the anterior ankle joint.  Full range of motion.  No pain with inversion or eversion.  No instability to  exam.  X-rays are negative for acute findings, bone spurs on heel noted.  Skin:    General: Skin is warm and dry.  Neurological:     Mental Status: She is alert.     Gait: Gait abnormal.  UC Treatments / Results  Labs (all labs ordered are listed, but only abnormal results are displayed) Labs Reviewed - No data to display  EKG   Radiology DG Ankle Complete Left  Result Date: 07/13/2023 CLINICAL DATA:  Left ankle pain EXAM: LEFT ANKLE COMPLETE - 3+ VIEW COMPARISON:  None Available. FINDINGS: Mild soft tissue swelling along the medial ankle without underlying bony abnormality. Small plantar and Achilles calcaneal spurs. Os peroneus. Suspected tibiotalar joint effusion based on the lateral projection. Plafond and talar dome appear intact. IMPRESSION: 1. Mild soft tissue swelling along the medial ankle without underlying bony abnormality. 2. Suspected tibiotalar joint effusion. 3. Small plantar and Achilles calcaneal spurs. Electronically Signed   By: Gaylyn Rong M.D.   On: 07/13/2023 12:47    Procedures Procedures (including critical care time)  Medications Ordered in UC Medications - No data to display  Initial Impression / Assessment and Plan / UC Course  I have reviewed the triage vital signs and the nursing notes.  Pertinent labs & imaging results that were available during my care of the patient were reviewed by me and considered in my medical decision making (see chart for details).     Does not specifically have tendinitis of the ankle.  Has pain in the ankle joint.  No injury.  Will treat with steroids and rest ice, follow-up if fails to improve Final Clinical Impressions(s) / UC Diagnoses   Final diagnoses:  Arthralgia of ankle or foot, left  Acute left ankle pain     Discharge Instructions      Limit walking while ankle is painful May stop boot when you can walk comfortably without it Use ice and elevation to reduce the pain Take prednisone as  directed.  This will reduce pain and inflammation.  Take 2 doses today Take tramadol if needed for pain.   Do not drive on tramadol Call your doctor if not improving by next week    ED Prescriptions     Medication Sig Dispense Auth. Provider   traMADol (ULTRAM) 50 MG tablet Take 1 tablet (50 mg total) by mouth every 6 (six) hours as needed. 15 tablet Eustace Moore, MD   predniSONE (DELTASONE) 20 MG tablet Take 1 tablet (20 mg total) by mouth 2 (two) times daily with a meal. 10 tablet Eustace Moore, MD      I have reviewed the PDMP during this encounter.   Eustace Moore, MD 07/13/23 1420

## 2023-07-13 NOTE — Discharge Instructions (Addendum)
Limit walking while ankle is painful May stop boot when you can walk comfortably without it Use ice and elevation to reduce the pain Take prednisone as directed.  This will reduce pain and inflammation.  Take 2 doses today Take tramadol if needed for pain.   Do not drive on tramadol Call your doctor if not improving by next week

## 2023-08-07 ENCOUNTER — Telehealth: Payer: Self-pay

## 2023-08-07 NOTE — Telephone Encounter (Signed)
Irena Cords Rep asked about boot bc it was never signed for. Per Rn, pt declined boot before discharge.

## 2023-09-20 ENCOUNTER — Other Ambulatory Visit: Payer: Self-pay | Admitting: Hematology and Oncology

## 2023-11-05 ENCOUNTER — Other Ambulatory Visit: Payer: Self-pay | Admitting: *Deleted

## 2024-01-07 ENCOUNTER — Other Ambulatory Visit: Payer: Self-pay | Admitting: General Surgery

## 2024-02-12 NOTE — Progress Notes (Addendum)
 Surgical Instructions    Your procedure is scheduled on February 20, 2024.  Report to [facility] Main Entrance "A" at 5:30 A.M., then check in with the Admitting office.  Call this number if you have problems the morning of surgery:  [phone]  If you have any questions prior to your surgery date call [phone]: Open Monday-Friday 8am-4pm If you experience any cold or flu symptoms such as cough, fever, chills, shortness of breath, etc. between now and your scheduled surgery, please notify us  at the above number.     Remember:  Do not eat after midnight the night before your surgery  You may drink clear liquids until 4:30 the morning of your surgery.   Clear liquids allowed are: Water, Non-Citrus Juices (without pulp), Carbonated Beverages, Clear Tea, Black Coffee Only (NO MILK, CREAM OR POWDERED CREAMER of any kind), and Gatorade.   Patient Instructions  The night before surgery:  No food after midnight. ONLY clear liquids after midnight    The day of surgery (if you have diabetes): Drink ONE (1) 12 oz G2 given to you in your pre admission testing appointment by 4:30 the morning of surgery. Drink in one sitting. Do not sip.  This drink was given to you during your hospital  pre-op appointment visit.  Nothing else to drink after completing the  12 oz bottle of G2.         If you have questions, please contact your surgeon's office.     Take these medicines the morning of surgery with A SIP OF WATER  amLODipine (NORVASC)  atorvastatin (LIPITOR)  metoprolol  succinate (TOPROL -XL)  tamoxifen  (NOLVADEX )    As of today, STOP taking any Aspirin (unless otherwise instructed by your surgeon) Aleve, Naproxen, Ibuprofen, Motrin, Advil, Goody's, BC's, all herbal medications, fish oil, and all vitamins. THIS INCLUDES YOUR meloxicam (MOBIC)                      Do NOT Smoke (Tobacco/Vaping) for 24 hours prior to your procedure.  If you use a CPAP at night, you may bring your  mask/headgear for your overnight stay.   Contacts, glasses, piercing's, hearing aid's, dentures or partials may not be worn into surgery, please bring cases for these belongings.    For patients admitted to the hospital, discharge time will be determined by your treatment team.   Patients discharged the day of surgery will not be allowed to drive home, and someone needs to stay with them for 24 hours.  SURGICAL WAITING ROOM VISITATION Patients having surgery or a procedure may have no more than 2 support people in the waiting area - these visitors may rotate.   Children under the age of 2 must have an adult with them who is not the patient. If the patient needs to stay at the hospital during part of their recovery, the visitor guidelines for inpatient rooms apply. Pre-op nurse will coordinate an appropriate time for 1 support person to accompany patient in pre-op.  This support person may not rotate.   Please refer to the [facility] website for the visitor guidelines for Inpatients (after your surgery is over and you are in a regular room).    Special instructions:   [place]- Preparing For Surgery  Before surgery, you can play an important role. Because skin is not sterile, your skin needs to be as free of germs as possible. You can reduce the number of germs on your skin by washing with CHG (  chlorahexidine gluconate) Soap before surgery.  CHG is an antiseptic cleaner which kills germs and bonds with the skin to continue killing germs even after washing.    Oral Hygiene is also important to reduce your risk of infection.  Remember - BRUSH YOUR TEETH THE MORNING OF SURGERY WITH YOUR REGULAR TOOTHPASTE  Please do not use if you have an allergy to CHG or antibacterial soaps. If your skin becomes reddened/irritated stop using the CHG.  Do not shave (including legs and underarms) for at least 48 hours prior to first CHG shower. It is OK to shave your face.  Please follow these instructions  carefully.   Shower the NIGHT BEFORE SURGERY and the MORNING OF SURGERY  If you chose to wash your hair, wash your hair first as usual with your normal shampoo.  After you shampoo, rinse your hair and body thoroughly to remove the shampoo.  Use CHG Soap as you would any other liquid soap. You can apply CHG directly to the skin and wash gently with a scrungie or a clean washcloth.   Apply the CHG Soap to your body ONLY FROM THE NECK DOWN.  Do not use on open wounds or open sores. Avoid contact with your eyes, ears, mouth and genitals (private parts). Wash Face and genitals (private parts)  with your normal soap.   Wash thoroughly, paying special attention to the area where your surgery will be performed.  Thoroughly rinse your body with warm water from the neck down.  DO NOT shower/wash with your normal soap after using and rinsing off the CHG Soap.  Pat yourself dry with a CLEAN TOWEL.  Wear CLEAN PAJAMAS to bed the night before surgery  Place CLEAN SHEETS on your bed the night before your surgery  DO NOT SLEEP WITH PETS.   Day of Surgery: Take a shower with CHG soap. Do not wear jewelry or makeup Do not wear lotions, powders, perfumes/colognes, or deodorant. Do not shave 48 hours prior to surgery.  Men may shave face and neck. Do not bring valuables to the hospital.  [facility] is not responsible for any belongings or valuables. Do not wear nail polish, gel polish, artificial nails, or any other type of covering on natural nails (fingers and toes) If you have artificial nails or gel coating that need to be removed by a nail salon, please have this removed prior to surgery. Artificial nails or gel coating may interfere with anesthesia's ability to adequately monitor your vital signs. Wear Clean/Comfortable clothing the morning of surgery Remember to brush your teeth WITH YOUR REGULAR TOOTHPASTE.   Please read over the following fact sheets that you were given.    If you  received a COVID test during your pre-op visit  it is requested that you wear a mask when out in public, stay away from anyone that may not be feeling well and notify your surgeon if you develop symptoms. If you have been in contact with anyone that has tested positive in the last 10 days please notify you surgeon.

## 2024-02-13 ENCOUNTER — Other Ambulatory Visit: Payer: Self-pay

## 2024-02-13 ENCOUNTER — Encounter (HOSPITAL_COMMUNITY)
Admission: RE | Admit: 2024-02-13 | Discharge: 2024-02-13 | Disposition: A | Discharge status: Home / Self Care | Source: Ambulatory Visit | Attending: General Surgery | Admitting: General Surgery

## 2024-02-13 ENCOUNTER — Encounter (HOSPITAL_COMMUNITY): Payer: Self-pay

## 2024-02-13 VITALS — BP 154/61 | HR 62 | Temp 97.9°F | Resp 18 | Ht 62.0 in | Wt 136.7 lb

## 2024-02-13 DIAGNOSIS — N183 Chronic kidney disease, stage 3 unspecified: Secondary | ICD-10-CM | POA: Diagnosis not present

## 2024-02-13 DIAGNOSIS — Z01812 Encounter for preprocedural laboratory examination: Secondary | ICD-10-CM | POA: Insufficient documentation

## 2024-02-13 DIAGNOSIS — I129 Hypertensive chronic kidney disease with stage 1 through stage 4 chronic kidney disease, or unspecified chronic kidney disease: Secondary | ICD-10-CM | POA: Diagnosis not present

## 2024-02-13 DIAGNOSIS — R7303 Prediabetes: Secondary | ICD-10-CM | POA: Insufficient documentation

## 2024-02-13 DIAGNOSIS — K219 Gastro-esophageal reflux disease without esophagitis: Secondary | ICD-10-CM | POA: Diagnosis not present

## 2024-02-13 DIAGNOSIS — E785 Hyperlipidemia, unspecified: Secondary | ICD-10-CM | POA: Diagnosis not present

## 2024-02-13 DIAGNOSIS — C50912 Malignant neoplasm of unspecified site of left female breast: Secondary | ICD-10-CM | POA: Diagnosis not present

## 2024-02-13 DIAGNOSIS — Z01818 Encounter for other preprocedural examination: Secondary | ICD-10-CM

## 2024-02-13 DIAGNOSIS — Z170 Estrogen receptor positive status [ER+]: Secondary | ICD-10-CM | POA: Diagnosis not present

## 2024-02-13 HISTORY — DX: Chronic kidney disease, unspecified: N18.9

## 2024-02-13 HISTORY — DX: Unspecified osteoarthritis, unspecified site: M19.90

## 2024-02-13 LAB — CBC
HCT: 36.6 % (ref 36.0–46.0)
Hemoglobin: 12.3 g/dL (ref 12.0–15.0)
MCH: 33.6 pg (ref 26.0–34.0)
MCHC: 33.6 g/dL (ref 30.0–36.0)
MCV: 100 fL (ref 80.0–100.0)
Platelets: 169 10*3/uL (ref 150–400)
RBC: 3.66 MIL/uL — ABNORMAL LOW (ref 3.87–5.11)
RDW: 12.4 % (ref 11.5–15.5)
WBC: 6.6 10*3/uL (ref 4.0–10.5)
nRBC: 0 % (ref 0.0–0.2)

## 2024-02-13 LAB — BASIC METABOLIC PANEL WITH GFR
Anion gap: 8 (ref 5–15)
BUN: 27 mg/dL — ABNORMAL HIGH (ref 8–23)
CO2: 24 mmol/L (ref 22–32)
Calcium: 9.9 mg/dL (ref 8.9–10.3)
Chloride: 107 mmol/L (ref 98–111)
Creatinine, Ser: 1.5 mg/dL — ABNORMAL HIGH (ref 0.44–1.00)
GFR, Estimated: 33 mL/min — ABNORMAL LOW (ref >60)
Glucose, Bld: 93 mg/dL (ref 70–99)
Potassium: 4.4 mmol/L (ref 3.5–5.1)
Sodium: 139 mmol/L (ref 135–145)

## 2024-02-13 NOTE — Progress Notes (Signed)
 PCP - Virgle Grime, MD  Cardiologist -   PPM/ICD - denies Device Orders - n/a Rep Notified - n/a  Chest x-ray -  EKG - requested from MD office Stress Test - denies ECHO - denies Cardiac Cath - denies  Sleep Study - denies CPAP -   DM denies  Blood Thinner Instructions:denies Aspirin Instructions:n/a  ERAS Protcol - Clear liquids until 4:30 PRE-SURGERY G2-   COVID TEST-    Anesthesia review: no  Patient denies shortness of breath, fever, cough and chest pain at PAT appointment   All instructions explained to the patient, with a verbal understanding of the material. Patient agrees to go over the instructions while at home for a better understanding. Patient also instructed to self quarantine after being tested for COVID-19. The opportunity to ask questions was provided.

## 2024-02-14 ENCOUNTER — Encounter (HOSPITAL_COMMUNITY): Payer: Self-pay

## 2024-02-14 NOTE — Anesthesia Preprocedure Evaluation (Addendum)
 Anesthesia Evaluation  Patient identified by MRN, date of birth, ID band Patient awake    Reviewed: Allergy & Precautions, H&P , NPO status , Patient's Chart, lab work & pertinent test results, reviewed documented beta blocker date and time   History of Anesthesia Complications (+) PONV and history of anesthetic complications  Airway Mallampati: II  TM Distance: >3 FB Neck ROM: Full    Dental no notable dental hx. (+) Teeth Intact, Dental Advisory Given   Pulmonary neg pulmonary ROS   Pulmonary exam normal breath sounds clear to auscultation       Cardiovascular hypertension (160/68 preop), Pt. on medications and Pt. on home beta blockers Normal cardiovascular exam+ Valvular Problems/Murmurs (mild-mod TR)  Rhythm:Regular Rate:Normal  Echo 04/03/23 ([facility]):  Left ventricle is normal in size.Normal left ventricular wall thickness. Normal global wall motion.  Visual EF is 65 to 70%. Doppler evidence of grade 1 (impaired) diastolic dysfunction. Trileaflet aortic valve with no regurgitation.  Mild aortic valve leaflet calcification. No mitral valve regurgitation.  Mild mitral valve leaflet calcification. Structurally normal tricuspid valve with mild to moderate regurgitation. Structurally normal pulmonic valve with trace regurgitation.    Neuro/Psych  PSYCHIATRIC DISORDERS Anxiety     negative neurological ROS     GI/Hepatic Neg liver ROS,GERD  Controlled,,  Endo/Other  negative endocrine ROS    Renal/GU negative Renal ROS  negative genitourinary   Musculoskeletal  (+) Arthritis , Osteoarthritis,    Abdominal   Peds negative pediatric ROS (+)  Hematology negative hematology ROS (+)   Anesthesia Other Findings   Reproductive/Obstetrics negative OB ROS                             Anesthesia Physical Anesthesia Plan  ASA: 2  Anesthesia Plan: General   Post-op Pain Management: Tylenol  PO  (pre-op)*   Induction: Intravenous  PONV Risk Score and Plan: 4 or greater and Ondansetron , Dexamethasone , Midazolam , Treatment may vary due to age or medical condition, Propofol  infusion and TIVA  Airway Management Planned: LMA  Additional Equipment: None  Intra-op Plan:   Post-operative Plan: Extubation in OR  Informed Consent: I have reviewed the patients History and Physical, chart, labs and discussed the procedure including the risks, benefits and alternatives for the proposed anesthesia with the patient or authorized representative who has indicated his/her understanding and acceptance.     Dental advisory given  Plan Discussed with: CRNA  Anesthesia Plan Comments: (Had issues w/ periop hypertension last surgery (surgical center), pt and family request for overnight admission. Intermittent issues w/ nausea, will TIVA )       Anesthesia Quick Evaluation

## 2024-02-14 NOTE — Progress Notes (Addendum)
 Anesthesia Chart Review:  Case: [id number] Date/Time: 02/20/24 0715   Procedure: BREAST LUMPECTOMY (Left) - LEFT BREAST LUMPECTOMY   Anesthesia type: General   Diagnosis: Recurrent breast cancer, left (HCC) [C50.912]   Pre-op diagnosis: LEFT BREAST CANCER RECURRENT   [facility] ROOM 09 / [facility] OR   Surgeons: Enid Harry, MD       DISCUSSION: Patient is an 89 year old female scheduled for the above procedure. She has had lumpectomies for bilateral breast cancer, last was a 2nd left breast lumpectomy (close anterior margin) in 2023 for recurrence. Recently she noted a nodule at the medial aspect of the scar. Punch biopsy showed "infiltrating ductal carcinoma that is her 2 negative, er pos at 95, pr pos at 80, Ki 30%." Repeat left breast lumpectomy is planned.    History includes never smoker, post-operative N/V, HTN, prediabetes, CKD (stage 3), HLD, GERD, breast cancer (right breast lumpectomy x2 05/03/10; left breast lumpectomy 06/09/15, radiation; left breast lumpectomy 09/03/22).  She had preoperative medical evaluation at [facility] on 01/13/24 by Yvonnie Heritage, NP. Per Preop exanimation documentation (see Care Everywere): "EKG unchanged from previous.   BP well controlled   She is cleared for the procedure from a medical standpoint. Okay to proceed with the lumpectomy".  Since then she also had a routine follow-up visit on 512/25 with Dr. Gloria Lares who noted surgery plans.    01/13/24 EKG at [facility] stable showing SB, first degree AV block, and poor r wave progression. July 2024 echo showed LVEF 65-70%, grade 1 DD, mild TR, trace PR.   Anesthesia team to evaluate on the day of surgery.     VS: BP (!) 154/61   Pulse 62   Temp 36.6 C (Oral)   Resp 18   Ht 5\' 2"  (1.575 m)   Wt 62 kg   SpO2 98%   BMI 25.00 kg/m    PROVIDERS: Virgle Grime, MD is PCP at [facility]) Murleen Arms, MD is HEM-ONC Retta Caster, MD is  RAD-ONC   LABS: Preoperative labs noted. Cr 1.50, BUN 27, eGFR 33. In review of Creatinine over the past year at [facility], Creatinine has been ~ 1.2 - 1.4 range and is monitored by primary care.  (all labs ordered are listed, but only abnormal results are displayed)  Labs Reviewed  BASIC METABOLIC PANEL WITH GFR - Abnormal; Notable for the following components:      Result Value   BUN 27 (*)    Creatinine, Ser 1.50 (*)    GFR, Estimated 33 (*)    All other components within normal limits  CBC - Abnormal; Notable for the following components:   RBC 3.66 (*)    All other components within normal limits   LFTs normal and A1c 6.1% on 02/11/24 ([facility])   EKG: EKG 01/13/24 ([facility], scanned under Media tab, AMB Correspondence): Sinus bradycardia at 56 bpm First degree AV block (PR 270 ms) Poor r wave progression-non-specific, consider old anterior infarct  EKG 08/28/22: Sinus rhythm with sinus arrhythmia with 1st degree A-V block Anterior infarct , age undetermined Abnormal ECG Confirmed by Grady Lawman ([id number]) on 08/29/2022 3:19:03 PM   CV: Echo 04/03/23 (done at [facility] for pedal edema evaluation; scanned under Media tab, AMB Correspondence):  Conclusions: Left ventricle is normal in size. Normal left ventricular wall thickness. Normal global wall motion.  Visual EF is 65 to 70%. Doppler evidence of grade 1 (impaired) diastolic dysfunction. Trileaflet aortic valve with no regurgitation.  Mild aortic  valve leaflet calcification. No mitral valve regurgitation.  Mild mitral valve leaflet calcification. Structurally normal tricuspid valve with mild to moderate regurgitation. Structurally normal pulmonic valve with trace regurgitation.   Past Medical History:  Diagnosis Date   Anxiety    Arthritis    Bilateral breast cancer (HCC)    CKD (chronic kidney disease)    GERD (gastroesophageal reflux disease)    Hyperlipidemia    Hypertension    Osteopenia    on actonel    PONV  (postoperative nausea and vomiting)    Radiation 08/04/15-09/02/15   left breast 42.72 gray, boosted o 52.72 gray    Past Surgical History:  Procedure Laterality Date   ABDOMINAL HYSTERECTOMY     APPENDECTOMY     BREAST LUMPECTOMY Left 09/03/2022   Procedure: LEFT BREAST LUMPECTOMY;  Surgeon: Enid Harry, MD;  Location: [facility];  Service: General;  Laterality: Left;   BREAST LUMPECTOMY WITH RADIOACTIVE SEED LOCALIZATION Left 06/09/2015   Procedure: BREAST LUMPECTOMY WITH RADIOACTIVE SEED LOCALIZATION;  Surgeon: Enid Harry, MD;  Location: [facility];  Service: General;  Laterality: Left;   BREAST SURGERY Right 2011   lumpectomy Dr Delane Fear    MEDICATIONS:  amLODipine (NORVASC) 5 MG tablet   atorvastatin (LIPITOR) 10 MG tablet   Calcium  Carb-Cholecalciferol (CALCIUM  + VITAMIN D3 PO)   denosumab  (PROLIA ) 60 MG/ML SOSY injection   hydrochlorothiazide (MICROZIDE) 12.5 MG capsule   losartan-hydrochlorothiazide (HYZAAR) 100-25 MG tablet   meloxicam (MOBIC) 7.5 MG tablet   metoprolol  succinate (TOPROL -XL) 25 MG 24 hr tablet   tamoxifen  (NOLVADEX ) 20 MG tablet   No current facility-administered medications for this encounter.    Ella Gun, PA-C Surgical Short Stay/Anesthesiology [facility] Phone [phone] [facility] Phone [phone] 02/14/2024 5:32 PM

## 2024-02-19 NOTE — H&P (Signed)
  89 year old female who has had history of bilateral breast cancer with radiation. I did a lumpectomy for a recurrence in 2023. This was a 1.6 cm grade 2 invasive ductal carcinoma. This was ER/PR positive, HER2 negative. This is close to the anterior margin but that anterior margin is her skin and I did remove some of this as well.she has done well but in last few months has noted a nodule at medial aspect of the scar. MM is fine. US  shows a 6 mm mass and she was referred due to concern for recurrence in scar  I did a punch biopsy in office for this and this returned as infiltrating ductal carcinoma that is her 2 negative, er pos at 95, pr pos at 80, Ki 30%. She is here with her daughter to discuss options  Medical History: Past Medical History:  Diagnosis Date  Anemia  Arthritis  GERD (gastroesophageal reflux disease)  History of cancer  Hyperlipidemia  Hypertension   Past Surgical History:  Procedure Laterality Date  ELBOW SURGERY 2011  FOOT SURGERY 2012  HYSTERECTOMY  MASTECTOMY PARTIAL / LUMPECTOMY    Allergies  Allergen Reactions  Morphine Other (See Comments)  vomiting   Current Outpatient Medications on File Prior to Visit  Medication Sig Dispense Refill  aspirin 81 MG EC tablet Take 81 mg by mouth once daily  atorvastatin (LIPITOR) 10 MG tablet Take 10 mg by mouth once daily  cyanocobalamin, vitamin B-12, (VITAMIN B-12 ORAL) Take by mouth  metoprolol  succinate (TOPROL -XL) 25 MG XL tablet Take 12.5 mg by mouth once daily  telmisartan -hydroCHLOROthiazide (MICARDIS  HCT) 80-25 mg tablet Take 1 tablet by mouth once daily   Family History  Problem Relation Age of Onset  High blood pressure (Hypertension) Mother    Social History   Tobacco Use  Smoking Status Never  Smokeless Tobacco Never  Marital status: Divorced  Tobacco Use  Smoking status: Never  Smokeless tobacco: Never  Substance and Sexual Activity  Alcohol use: Never  Drug use: Never   Objective:    Physical Exam   General nad Left breast without masses, medial aspect of IM incision has scarring now after punch biopsy  Assessment and Plan:   Recurrent breast cancer, left (CMS/HHS-HCC)  Left breast lumpectomy  We discussed all her options. I think that options are mastectomy vs another lumpectomy. I think at age 9 we can do lumpectomy for her again and omit radiotherapy. She would consider antiestrogens. Will reach out to PCP as she had hypertensive issues after last surgery to make sure all OK. I asked her to check bps at home for now also. Discussed surgery, positive margin risk etc with her and her daughter today

## 2024-02-20 ENCOUNTER — Ambulatory Visit (HOSPITAL_COMMUNITY): Payer: Self-pay | Admitting: Vascular Surgery

## 2024-02-20 ENCOUNTER — Encounter (HOSPITAL_COMMUNITY): Payer: Self-pay | Admitting: General Surgery

## 2024-02-20 ENCOUNTER — Encounter (HOSPITAL_COMMUNITY)
Admission: RE | Disposition: A | Discharge status: Home / Self Care | Payer: Self-pay | Source: Home / Self Care | Attending: General Surgery

## 2024-02-20 ENCOUNTER — Other Ambulatory Visit: Payer: Self-pay

## 2024-02-20 ENCOUNTER — Observation Stay (HOSPITAL_COMMUNITY)
Admission: RE | Admit: 2024-02-20 | Discharge: 2024-02-21 | Disposition: A | Discharge status: Home / Self Care | Attending: General Surgery | Admitting: General Surgery

## 2024-02-20 ENCOUNTER — Ambulatory Visit (HOSPITAL_COMMUNITY): Admitting: Anesthesiology

## 2024-02-20 DIAGNOSIS — E7800 Pure hypercholesterolemia, unspecified: Secondary | ICD-10-CM

## 2024-02-20 DIAGNOSIS — I10 Essential (primary) hypertension: Secondary | ICD-10-CM

## 2024-02-20 DIAGNOSIS — C50912 Malignant neoplasm of unspecified site of left female breast: Principal | ICD-10-CM | POA: Insufficient documentation

## 2024-02-20 DIAGNOSIS — F419 Anxiety disorder, unspecified: Secondary | ICD-10-CM | POA: Diagnosis not present

## 2024-02-20 DIAGNOSIS — Z9889 Other specified postprocedural states: Principal | ICD-10-CM

## 2024-02-20 DIAGNOSIS — Z79899 Other long term (current) drug therapy: Secondary | ICD-10-CM | POA: Diagnosis not present

## 2024-02-20 DIAGNOSIS — Z7982 Long term (current) use of aspirin: Secondary | ICD-10-CM | POA: Diagnosis not present

## 2024-02-20 HISTORY — PX: BREAST LUMPECTOMY: SHX2

## 2024-02-20 SURGERY — BREAST LUMPECTOMY
Anesthesia: General | Site: Breast | Laterality: Left

## 2024-02-20 MED ORDER — BUPIVACAINE-EPINEPHRINE 0.25% -1:200000 IJ SOLN
INTRAMUSCULAR | Status: DC | PRN
  Administered 2024-02-20: 10 mL

## 2024-02-20 MED ORDER — ORAL CARE MOUTH RINSE: 15.0000 mL | Freq: Once | OROMUCOSAL | Status: AC

## 2024-02-20 MED ORDER — ONDANSETRON HCL 4 MG/2ML IJ SOLN
INTRAMUSCULAR | Status: DC | PRN
  Administered 2024-02-20: 4 mg via INTRAVENOUS

## 2024-02-20 MED ORDER — PROPOFOL 500 MG/50ML IV EMUL
INTRAVENOUS | Status: DC | PRN
  Administered 2024-02-20: 125 ug/kg/min via INTRAVENOUS

## 2024-02-20 MED ORDER — LIDOCAINE 2% (20 MG/ML) 5 ML SYRINGE
INTRAMUSCULAR | Status: DC | PRN
  Administered 2024-02-20: 60 mg via INTRAVENOUS

## 2024-02-20 MED ORDER — ENSURE PRE-SURGERY PO LIQD: 296.0000 mL | Freq: Once | ORAL | Status: DC

## 2024-02-20 MED ORDER — BUPIVACAINE-EPINEPHRINE (PF) 0.25% -1:200000 IJ SOLN
INTRAMUSCULAR | Status: AC
  Filled 2024-02-20: qty 30

## 2024-02-20 MED ORDER — DEXAMETHASONE SODIUM PHOSPHATE 10 MG/ML IJ SOLN
INTRAMUSCULAR | Status: DC | PRN
  Administered 2024-02-20: 10 mg via INTRAVENOUS

## 2024-02-20 MED ORDER — PHENYLEPHRINE 80 MCG/ML (10ML) SYRINGE FOR IV PUSH (FOR BLOOD PRESSURE SUPPORT)
PREFILLED_SYRINGE | INTRAVENOUS | Status: DC | PRN
  Administered 2024-02-20 (×3): 80 ug via INTRAVENOUS
  Administered 2024-02-20: 160 ug via INTRAVENOUS

## 2024-02-20 MED ORDER — TRAMADOL HCL 50 MG PO TABS: 50.0000 mg | ORAL_TABLET | Freq: Four times a day (QID) | ORAL | Status: DC | PRN

## 2024-02-20 MED ORDER — CHLORHEXIDINE GLUCONATE CLOTH 2 % EX PADS: 6.0000 | MEDICATED_PAD | Freq: Once | CUTANEOUS | Status: DC

## 2024-02-20 MED ORDER — LOSARTAN POTASSIUM 50 MG PO TABS
100.0000 mg | ORAL_TABLET | Freq: Every day | ORAL | Status: DC
  Administered 2024-02-20: 100 mg via ORAL
  Filled 2024-02-20: qty 2

## 2024-02-20 MED ORDER — ACETAMINOPHEN 500 MG PO TABS
1000.0000 mg | ORAL_TABLET | Freq: Four times a day (QID) | ORAL | Status: DC
  Administered 2024-02-20 – 2024-02-21 (×4): 1000 mg via ORAL
  Filled 2024-02-20 (×4): qty 2

## 2024-02-20 MED ORDER — LOSARTAN POTASSIUM-HCTZ 100-25 MG PO TABS: 1.0000 | ORAL_TABLET | Freq: Every day | ORAL | Status: DC

## 2024-02-20 MED ORDER — ONDANSETRON HCL 4 MG/2ML IJ SOLN: 4.0000 mg | Freq: Four times a day (QID) | INTRAMUSCULAR | Status: DC | PRN

## 2024-02-20 MED ORDER — ACETAMINOPHEN 500 MG PO TABS
1000.0000 mg | ORAL_TABLET | Freq: Once | ORAL | Status: AC
  Administered 2024-02-20: 1000 mg via ORAL
  Filled 2024-02-20: qty 2

## 2024-02-20 MED ORDER — PROPOFOL 10 MG/ML IV BOLUS
INTRAVENOUS | Status: AC
  Filled 2024-02-20: qty 20

## 2024-02-20 MED ORDER — ONDANSETRON 4 MG PO TBDP: 4.0000 mg | ORAL_TABLET | Freq: Four times a day (QID) | ORAL | Status: DC | PRN

## 2024-02-20 MED ORDER — CEFAZOLIN SODIUM-DEXTROSE 2-4 GM/100ML-% IV SOLN
2.0000 g | INTRAVENOUS | Status: AC
  Administered 2024-02-20: 2 g via INTRAVENOUS
  Filled 2024-02-20: qty 100

## 2024-02-20 MED ORDER — FENTANYL CITRATE (PF) 250 MCG/5ML IJ SOLN
INTRAMUSCULAR | Status: DC | PRN
  Administered 2024-02-20: 50 ug via INTRAVENOUS
  Administered 2024-02-20: 25 ug via INTRAVENOUS

## 2024-02-20 MED ORDER — METOPROLOL TARTRATE 5 MG/5ML IV SOLN: 5.0000 mg | Freq: Four times a day (QID) | INTRAVENOUS | Status: DC | PRN

## 2024-02-20 MED ORDER — METOPROLOL SUCCINATE ER 25 MG PO TB24: 25.0000 mg | ORAL_TABLET | Freq: Every day | ORAL | Status: DC

## 2024-02-20 MED ORDER — LACTATED RINGERS IV SOLN: INTRAVENOUS | Status: DC

## 2024-02-20 MED ORDER — 0.9 % SODIUM CHLORIDE (POUR BTL) OPTIME
TOPICAL | Status: DC | PRN
  Administered 2024-02-20: 1000 mL

## 2024-02-20 MED ORDER — AMLODIPINE BESYLATE 2.5 MG PO TABS
2.5000 mg | ORAL_TABLET | Freq: Every day | ORAL | Status: DC
  Filled 2024-02-20: qty 1

## 2024-02-20 MED ORDER — SIMETHICONE 80 MG PO CHEW: 40.0000 mg | CHEWABLE_TABLET | Freq: Four times a day (QID) | ORAL | Status: DC | PRN

## 2024-02-20 MED ORDER — PROPOFOL 10 MG/ML IV BOLUS
INTRAVENOUS | Status: DC | PRN
  Administered 2024-02-20: 40 mg via INTRAVENOUS
  Administered 2024-02-20: 30 mg via INTRAVENOUS
  Administered 2024-02-20: 130 mg via INTRAVENOUS

## 2024-02-20 MED ORDER — HYDROCHLOROTHIAZIDE 12.5 MG PO TABS
12.5000 mg | ORAL_TABLET | Freq: Every day | ORAL | Status: DC
  Administered 2024-02-20: 12.5 mg via ORAL
  Filled 2024-02-20: qty 1

## 2024-02-20 MED ORDER — CHLORHEXIDINE GLUCONATE 0.12 % MT SOLN
15.0000 mL | Freq: Once | OROMUCOSAL | Status: AC
  Administered 2024-02-20: 15 mL via OROMUCOSAL
  Filled 2024-02-20: qty 15

## 2024-02-20 MED ORDER — HYDROCHLOROTHIAZIDE 25 MG PO TABS
25.0000 mg | ORAL_TABLET | Freq: Every day | ORAL | Status: DC
  Administered 2024-02-20: 25 mg via ORAL
  Filled 2024-02-20: qty 1

## 2024-02-20 MED ORDER — FENTANYL CITRATE (PF) 250 MCG/5ML IJ SOLN
INTRAMUSCULAR | Status: AC
  Filled 2024-02-20: qty 5

## 2024-02-20 SURGICAL SUPPLY — 29 items
BINDER BREAST LRG (GAUZE/BANDAGES/DRESSINGS) IMPLANT
BINDER BREAST XLRG (GAUZE/BANDAGES/DRESSINGS) IMPLANT
CANISTER SUCTION 3000ML PPV (SUCTIONS) ×1 IMPLANT
CHLORAPREP W/TINT 26 (MISCELLANEOUS) ×1 IMPLANT
CLIP APPLIE 9.375 MED OPEN (MISCELLANEOUS) IMPLANT
COVER PROBE W GEL 5X96 (DRAPES) ×1 IMPLANT
COVER SURGICAL LIGHT HANDLE (MISCELLANEOUS) ×1 IMPLANT
DERMABOND ADVANCED .7 DNX12 (GAUZE/BANDAGES/DRESSINGS) ×1 IMPLANT
DRAPE CHEST BREAST 15X10 FENES (DRAPES) ×1 IMPLANT
ELECT COATED BLADE 2.86 ST (ELECTRODE) ×1 IMPLANT
ELECTRODE REM PT RTRN 9FT ADLT (ELECTROSURGICAL) ×1 IMPLANT
GLOVE BIO SURGEON STRL SZ7 (GLOVE) ×2 IMPLANT
GLOVE BIOGEL PI IND STRL 7.5 (GLOVE) ×1 IMPLANT
GOWN STRL REUS W/ TWL LRG LVL3 (GOWN DISPOSABLE) ×2 IMPLANT
KIT BASIN OR (CUSTOM PROCEDURE TRAY) ×1 IMPLANT
NDL HYPO 25GX1X1/2 BEV (NEEDLE) ×1 IMPLANT
NEEDLE HYPO 25GX1X1/2 BEV (NEEDLE) ×1 IMPLANT
NS IRRIG 1000ML POUR BTL (IV SOLUTION) ×1 IMPLANT
PACK GENERAL/GYN (CUSTOM PROCEDURE TRAY) ×1 IMPLANT
STRIP CLOSURE SKIN 1/2X4 (GAUZE/BANDAGES/DRESSINGS) ×1 IMPLANT
SUT MNCRL AB 4-0 PS2 18 (SUTURE) ×1 IMPLANT
SUT MON AB 5-0 PS2 18 (SUTURE) IMPLANT
SUT SILK 2 0 SH (SUTURE) IMPLANT
SUT VIC AB 2-0 SH 27XBRD (SUTURE) ×1 IMPLANT
SUT VIC AB 3-0 SH 18 (SUTURE) IMPLANT
SUT VIC AB 3-0 SH 27X BRD (SUTURE) ×1 IMPLANT
SYR CONTROL 10ML LL (SYRINGE) ×1 IMPLANT
TOWEL GREEN STERILE (TOWEL DISPOSABLE) ×1 IMPLANT
TOWEL GREEN STERILE FF (TOWEL DISPOSABLE) ×1 IMPLANT

## 2024-02-20 NOTE — Op Note (Signed)
 Preoperative diagnosis: Left breast cancer, recurrent Postoperative diagnosis: Same as above Procedure: Left breast re-excision lumpectomy Surgeon: Dr. Donavan Fuchs Anesthesia: General Estimated blood loss: Minimal Complications: None Drains: None Specimens: Left breast tissue marked short superior, long lateral Special count was correct at completion Disposition recovery stable addition   Indications:89 year old female with recurrence of breast cancer at medial aspect near IM fold.  We discussed re-excision.    Procedure: After informed consent was obtained she was taken to the operating room.  She was given antibiotics.  SCDs were in place.  She was then placed under general anesthesia without complication.  She was prepped and draped in standard sterile surgical fashion.  Surgical timeout was then performed.   I then made an elliptical incision around the tumor. I removed this down to the fascia. This included the tumor and an attempt to get a clear margin.   I then obtained hemostasis.   The skin was then closed with 3-0 Vicryl 4-0 Monocryl.  Glue insertion was applied.  She tolerated this well was extubated and transferred to recovery stable.

## 2024-02-20 NOTE — Interval H&P Note (Signed)
 History and Physical Interval Note:  02/20/2024 7:01 AM  Tammy Oneill  has presented today for surgery, with the diagnosis of LEFT BREAST CANCER RECURRENT.  The various methods of treatment have been discussed with the patient and family. After consideration of risks, benefits and other options for treatment, the patient has consented to  Procedure(s) with comments: BREAST LUMPECTOMY (Left) - LEFT BREAST LUMPECTOMY as a surgical intervention.  The patient's history has been reviewed, patient examined, no change in status, stable for surgery.  I have reviewed the patient's chart and labs.  Questions were answered to the patient's satisfaction.     Enid Harry

## 2024-02-20 NOTE — Plan of Care (Signed)

## 2024-02-20 NOTE — Transfer of Care (Signed)
 Immediate Anesthesia Transfer of Care Note  Patient: Tammy Oneill  Procedure(s) Performed: BREAST LUMPECTOMY (Left: Breast)  Patient Location: PACU  Anesthesia Type:General  Level of Consciousness: drowsy  Airway & Oxygen Therapy: Patient Spontanous Breathing  Post-op Assessment: Report given to RN and Post -op Vital signs reviewed and stable  Post vital signs: Reviewed and stable  Last Vitals:  Vitals Value Taken Time  BP 107/41 02/20/24 0804  Temp    Pulse 55 02/20/24 0805  Resp    SpO2 91 % 02/20/24 0805  Vitals shown include unfiled device data.  Last Pain:  Vitals:   02/20/24 0616  TempSrc:   PainSc: 0-No pain         Complications: No notable events documented.

## 2024-02-20 NOTE — Anesthesia Procedure Notes (Signed)
 Procedure Name: LMA Insertion Date/Time: 02/20/2024 7:27 AM  Performed by: Laroy Plunk, CRNAPre-anesthesia Checklist: Patient identified, Emergency Drugs available, Suction available and Patient being monitored Patient Re-evaluated:Patient Re-evaluated prior to induction Oxygen Delivery Method: Circle System Utilized Preoxygenation: Pre-oxygenation with 100% oxygen Induction Type: IV induction Ventilation: Mask ventilation without difficulty LMA: LMA inserted LMA Size: 4.0 Number of attempts: 1 Placement Confirmation: positive ETCO2 Tube secured with: Tape Dental Injury: Teeth and Oropharynx as per pre-operative assessment

## 2024-02-20 NOTE — Discharge Instructions (Signed)
 [facility] Office Phone Number [phone]  POST OP INSTRUCTIONS Take 400 mg of ibuprofen every 8 hours or 650 mg tylenol every 6 hours for next 72 hours then as needed. Use ice several times daily also.  A prescription for pain medication may be given to you upon discharge.  Take your pain medication as prescribed, if needed.  If narcotic pain medicine is not needed, then you may take acetaminophen (Tylenol), naprosyn (Alleve) or ibuprofen (Advil) as needed. Take your usually prescribed medications unless otherwise directed If you need a refill on your pain medication, please contact your pharmacy.  They will contact our office to request authorization.  Prescriptions will not be filled after 5pm or on week-ends. You should eat very light the first 24 hours after surgery, such as soup, crackers, pudding, etc.  Resume your normal diet the day after surgery. Most patients will experience some swelling and bruising in the breast.  Ice packs and a good support bra will help.  Wear the breast binder provided or a sports bra for 72 hours day and night.  After that wear a sports bra during the day until you return to the office. Swelling and bruising can take several days to resolve.  It is common to experience some constipation if taking pain medication after surgery.  Increasing fluid intake and taking a stool softener will usually help or prevent this problem from occurring.  A mild laxative (Milk of Magnesia or Miralax) should be taken according to package directions if there are no bowel movements after 48 hours. I used skin glue on the incision, you may shower in 24 hours.  The glue will flake off over the next 2-3 weeks.  Any sutures or staples will be removed at the office during your follow-up visit. ACTIVITIES:  You may resume regular daily activities (gradually increasing) beginning the next day.  Wearing a good support bra or sports bra minimizes pain and swelling.  You may have  sexual intercourse when it is comfortable. You may drive when you no longer are taking prescription pain medication, you can comfortably wear a seatbelt, and you can safely maneuver your car and apply brakes. RETURN TO WORK:  ______________________________________________________________________________________ Bonita Quin should see your doctor in the office for a follow-up appointment approximately two weeks after your surgery.  Your doctor's nurse will typically make your follow-up appointment when she calls you with your pathology report.  Expect your pathology report 3-4 business days after your surgery.  You may call to check if you do not hear from [place] after three days. OTHER INSTRUCTIONS: _______________________________________________________________________________________________ _____________________________________________________________________________________________________________________________________ _____________________________________________________________________________________________________________________________________ _____________________________________________________________________________________________________________________________________  WHEN TO CALL DR Alaija Ruble: Fever over 101.0 Nausea and/or vomiting. Extreme swelling or bruising. Continued bleeding from incision. Increased pain, redness, or drainage from the incision.  The clinic staff is available to answer your questions during regular business hours.  Please don't hesitate to call and ask to speak to one of the nurses for clinical concerns.  If you have a medical emergency, go to the nearest emergency room or call 911.  A surgeon from [facility] is always on call at the hospital.  For further questions, please visit [email or web address]

## 2024-02-20 NOTE — Anesthesia Postprocedure Evaluation (Signed)
 Anesthesia Post Note  Patient: BRITYN MASTROGIOVANNI  Procedure(s) Performed: BREAST LUMPECTOMY (Left: Breast)     Patient location during evaluation: PACU Anesthesia Type: General Level of consciousness: awake and alert, oriented and patient cooperative Pain management: pain level controlled Vital Signs Assessment: post-procedure vital signs reviewed and stable Respiratory status: spontaneous breathing, nonlabored ventilation and respiratory function stable Cardiovascular status: blood pressure returned to baseline and stable Postop Assessment: no apparent nausea or vomiting Anesthetic complications: no   No notable events documented.  Last Vitals:  Vitals:   02/20/24 0900 02/20/24 0923  BP: (!) 133/50 (!) 152/125  Pulse: (!) 55 62  Resp: 18 18  Temp: 36.4 C 36.7 C  SpO2: 95% 95%    Last Pain:  Vitals:   02/20/24 0900  TempSrc:   PainSc: 0-No pain                 Jacquelyne Matte

## 2024-02-21 ENCOUNTER — Encounter (HOSPITAL_COMMUNITY): Payer: Self-pay | Admitting: General Surgery

## 2024-02-21 NOTE — Progress Notes (Addendum)
 Patient alert and oriented, mae's well, voiding adequate amount of urine, swallowing without difficulty, no c/o pain at time of discharge. Patient discharged home with family. Discharged instructions given to patient. Patient and family stated understanding of instructions given. Patient has an appointment with Dr. Delane Fear

## 2024-02-21 NOTE — Care Management Obs Status (Signed)
 MEDICARE OBSERVATION STATUS NOTIFICATION   Patient Details  Name: Tammy Oneill MRN: [id number] Date of Birth: 1933/02/12   Medicare Observation Status Notification Given:  Yes    Jonathan Neighbor, RN 02/21/2024, 8:56 AM

## 2024-02-24 LAB — SURGICAL PATHOLOGY

## 2024-02-24 NOTE — Discharge Summary (Signed)
 Physician Discharge Summary  Patient ID: Tammy Oneill MRN: [id number] DOB/AGE: [birth year] 89 y.o.  Admit date: 02/20/2024 Discharge date: 02/24/2024  Admission Diagnoses: Breast cancer  Discharge Diagnoses:  Principal Problem:   Status post right breast lumpectomy   Discharged Condition: good  Hospital Course: 89 yof admitted and underwent right breast lumpectojy. Remained overnight due to bp concerns from last surgery. Discharged following morning doing well  Consults: None  Significant Diagnostic Studies: none  Treatments: surgery: right breast lumpectomy   Disposition: Discharge disposition: 01-Home or Self Care       Discharge Instructions     Call MD for:  persistant nausea and vomiting   Complete by: As directed    Call MD for:  redness, tenderness, or signs of infection (pain, swelling, redness, odor or green/yellow discharge around incision site)   Complete by: As directed    Call MD for:  severe uncontrolled pain   Complete by: As directed    Call MD for:  temperature >100.4   Complete by: As directed    Diet general   Complete by: As directed    Driving Restrictions   Complete by: As directed    Do not drive while taking pain medications   Increase activity slowly   Complete by: As directed    May shower / Bathe   Complete by: As directed       Allergies as of 02/21/2024       Reactions   Morphine And Codeine    vomiting   Morphine Sulfate Nausea And Vomiting        Medication List     TAKE these medications    amLODipine  5 MG tablet Commonly known as: NORVASC  Take 2.5 mg by mouth daily. Per patient she is taking 2.5mg    atorvastatin 10 MG tablet Commonly known as: LIPITOR Take 10 mg by mouth daily.   CALCIUM  + VITAMIN D3 PO Take 1 tablet by mouth daily.   hydrochlorothiazide  12.5 MG capsule Commonly known as: MICROZIDE  Take 12.5 mg by mouth daily.   losartan -hydrochlorothiazide  100-25 MG tablet Commonly known as:  HYZAAR Take 1 tablet by mouth.   meloxicam 7.5 MG tablet Commonly known as: MOBIC Take 7.5 mg by mouth daily.   metoprolol  succinate 25 MG 24 hr tablet Commonly known as: TOPROL -XL Take 1/2 tablet daily   Prolia  60 MG/ML Sosy injection Generic drug: denosumab  60 mg every 6 (six) months.   tamoxifen  20 MG tablet Commonly known as: NOLVADEX  TAKE 1 TABLET BY MOUTH EVERY DAY        Follow-up Information     Enid Harry, MD Follow up in 3 week(s).   Specialty: General Surgery Contact information: [address] [place] [address] [phone]                 Signed: Enid Harry 02/24/2024, 9:38 AM

## 2024-07-27 ENCOUNTER — Other Ambulatory Visit: Payer: Self-pay

## 2024-07-27 ENCOUNTER — Ambulatory Visit
Admission: RE | Admit: 2024-07-27 | Discharge: 2024-07-27 | Disposition: A | Discharge status: Home / Self Care | Source: Ambulatory Visit | Attending: Family Medicine | Admitting: Family Medicine

## 2024-07-27 VITALS — BP 151/65 | HR 71 | Temp 99.0°F | Resp 17

## 2024-07-27 DIAGNOSIS — B349 Viral infection, unspecified: Secondary | ICD-10-CM

## 2024-07-27 DIAGNOSIS — R051 Acute cough: Secondary | ICD-10-CM

## 2024-07-27 LAB — POC SOFIA SARS ANTIGEN FIA: SARS Coronavirus 2 Ag: NEGATIVE

## 2024-07-27 MED ORDER — DOXYCYCLINE HYCLATE 100 MG PO CAPS: 100.0000 mg | ORAL_CAPSULE | Freq: Two times a day (BID) | ORAL | 0 refills | Status: AC

## 2024-07-27 NOTE — Discharge Instructions (Addendum)
 You tested negative for COVID.  A provisional prescription for doxycycline has been provided.  Please do not take this antibiotic unless your symptoms do not improve or worsen by November 13.  Lots of rest and fluids.  Please follow-up with your PCP in 2 to 3 days for recheck.  Please go to the emergency room for any worsening symptoms.  Hope you feel better soon!

## 2024-07-27 NOTE — ED Triage Notes (Signed)
 Pt c/o productive cough w/green mucousx4d

## 2024-07-27 NOTE — ED Provider Notes (Signed)
 [facility]    CSN: [id number] Arrival date & time: 07/27/24  1322      History   Chief Complaint Chief Complaint  Patient presents with   Influenza    This appointment is for my 89 year old mother how is experiencing a lot of congestion since last week. - Entered by patient    HPI Tammy Oneill is a 89 y.o. female  presents for evaluation of URI symptoms for 4 days. Patient reports associated symptoms of cough, congestion with green mucus for. Denies N/V/D, [place] a sore throat, ear pain, body aches, shortness of breath. Patient does not have a hx of asthma. Patient is not an active smoker.   Reports sick contacts as she works for [employer] last week.  Pt has taken DayQuil and NyQuil OTC for symptoms. Pt has no other concerns at this time.    Influenza Presenting symptoms: cough   Associated symptoms: nasal congestion     Past Medical History:  Diagnosis Date   Anxiety    Arthritis    Bilateral breast cancer (HCC)    CKD (chronic kidney disease)    GERD (gastroesophageal reflux disease)    Hyperlipidemia    Hypertension    Osteopenia    on actonel    PONV (postoperative nausea and vomiting)    Radiation 08/04/15-09/02/15   left breast 42.72 gray, boosted o 52.72 gray    Patient Active Problem List   Diagnosis Date Noted   Status post right breast lumpectomy 02/20/2024   Malignant neoplasm of lower-inner quadrant of left breast in female, estrogen receptor positive (HCC) 08/16/2022   Malignant neoplasm of overlapping sites of right breast in female, estrogen receptor positive (HCC) 06/14/2016   Malignant neoplasm of upper-inner quadrant of left breast in female, estrogen receptor positive (HCC) 07/20/2015   Genetic testing 06/08/2015   Sinus bradycardia 04/10/2013   Pure hypercholesterolemia 06/21/2011   Benign hypertensive heart disease without heart failure 06/21/2011    Past Surgical History:  Procedure Laterality Date   ABDOMINAL HYSTERECTOMY      APPENDECTOMY     BREAST LUMPECTOMY Left 09/03/2022   Procedure: LEFT BREAST LUMPECTOMY;  Surgeon: Ebbie Cough, MD;  Location: [facility];  Service: General;  Laterality: Left;   BREAST LUMPECTOMY Left 02/20/2024   Procedure: BREAST LUMPECTOMY;  Surgeon: Ebbie Cough, MD;  Location: [facility];  Service: General;  Laterality: Left;  LEFT BREAST LUMPECTOMY   BREAST LUMPECTOMY WITH RADIOACTIVE SEED LOCALIZATION Left 06/09/2015   Procedure: BREAST LUMPECTOMY WITH RADIOACTIVE SEED LOCALIZATION;  Surgeon: Cough Ebbie, MD;  Location: [facility];  Service: General;  Laterality: Left;   BREAST SURGERY Right 2011   lumpectomy Dr Ebbie    OB History   No obstetric history on file.      Home Medications    Prior to Admission medications   Medication Sig Start Date End Date Taking? Authorizing Provider  doxycycline (VIBRAMYCIN) 100 MG capsule Take 1 capsule (100 mg total) by mouth 2 (two) times daily for 7 days. 07/30/24 08/06/24 Yes Loreda Myla SAUNDERS, NP  amLODipine  (NORVASC ) 5 MG tablet Take 2.5 mg by mouth daily. Per patient she is taking 2.5mg     [provider]  atorvastatin (LIPITOR) 10 MG tablet Take 10 mg by mouth daily. 06/06/15   [provider]  Calcium  Carb-Cholecalciferol (CALCIUM  + VITAMIN D3 PO) Take 1 tablet by mouth daily.    [provider]  denosumab  (PROLIA ) 60 MG/ML SOSY injection 60 mg  every 6 (six) months.    [provider]  hydrochlorothiazide  (MICROZIDE ) 12.5 MG capsule Take 12.5 mg by mouth daily.    [provider]  losartan -hydrochlorothiazide  (HYZAAR) 100-25 MG tablet Take 1 tablet by mouth.    [provider]  meloxicam (MOBIC) 7.5 MG tablet Take 7.5 mg by mouth daily.    [provider]  metoprolol  succinate (TOPROL -XL) 25 MG 24 hr tablet Take 1/2 tablet daily 04/06/14   Dominick Ned, MD  tamoxifen  (NOLVADEX ) 20 MG tablet TAKE 1 TABLET BY MOUTH EVERY DAY 09/20/23    Iruku, Praveena, MD    Family History Family History  Problem Relation Age of Onset   Bladder Cancer Mother    Lung cancer Father    Breast cancer Daughter    Breast cancer Daughter     Social History Social History   Tobacco Use   Smoking status: Never   Smokeless tobacco: Never  Vaping Use   Vaping status: Never Used  Substance Use Topics   Alcohol use: No   Drug use: No     Allergies   Morphine and codeine and Morphine sulfate   Review of Systems Review of Systems  HENT:  Positive for congestion.   Respiratory:  Positive for cough.      Physical Exam Triage Vital Signs ED Triage Vitals  Encounter Vitals Group     BP 07/27/24 1339 (!) 151/65     Girls Systolic BP Percentile --      Girls Diastolic BP Percentile --      Boys Systolic BP Percentile --      Boys Diastolic BP Percentile --      Pulse Rate 07/27/24 1339 71     Resp 07/27/24 1339 17     Temp 07/27/24 1339 99 F (37.2 C)     Temp Source 07/27/24 1339 Oral     SpO2 07/27/24 1339 96 %     Weight --      Height --      Head Circumference --      Peak Flow --      Pain Score 07/27/24 1337 0     Pain Loc --      Pain Education --      Exclude from Growth Chart --    No data found.  Updated Vital Signs BP (!) 151/65   Pulse 71   Temp 99 F (37.2 C) (Oral)   Resp 17   SpO2 96%   Visual Acuity Right Eye Distance:   Left Eye Distance:   Bilateral Distance:    Right Eye Near:   Left Eye Near:    Bilateral Near:     Physical Exam Vitals and nursing note reviewed.  Constitutional:      General: She is not in acute distress.    Appearance: She is well-developed. She is not ill-appearing.  HENT:     Head: Normocephalic and atraumatic.     Right Ear: Tympanic membrane and ear canal normal.     Left Ear: Tympanic membrane and ear canal normal.     Nose: Congestion present.     Mouth/Throat:     Mouth: Mucous membranes are moist.     Pharynx: Oropharynx is clear. Uvula midline. No  posterior oropharyngeal erythema.     Tonsils: No tonsillar exudate or tonsillar abscesses.  Eyes:     Conjunctiva/sclera: Conjunctivae normal.     Pupils: Pupils are equal, round, and reactive to light.  Cardiovascular:  Rate and Rhythm: Normal rate and regular rhythm.     Heart sounds: Normal heart sounds.  Pulmonary:     Effort: Pulmonary effort is normal.     Breath sounds: Normal breath sounds.  Musculoskeletal:     Cervical back: Normal range of motion and neck supple.  Lymphadenopathy:     Cervical: No cervical adenopathy.  Skin:    General: Skin is warm and dry.  Neurological:     General: No focal deficit present.     Mental Status: She is alert and oriented to person, place, and time.  Psychiatric:        Mood and Affect: Mood normal.        Behavior: Behavior normal.      UC Treatments / Results  Labs (all labs ordered are listed, but only abnormal results are displayed) Labs Reviewed  POC SOFIA SARS ANTIGEN FIA    Contains abnormal data Basic metabolic panel per protocol Order: [id number]  Status: Final result     Next appt: None     Dx: Pre-op testing   Test Result Released: Yes (seen)   0 Result Notes          Component Ref Range & Units (hover) 5 mo ago (02/13/24) 1 yr ago (08/28/22) 3 yr ago (08/01/20) 6 yr ago (07/16/18) 7 yr ago (07/10/17) 8 yr ago (06/14/16) 8 yr ago (10/12/15)  Sodium 139 140 138 139 140 R 139 R 136 R  Potassium 4.4 3.6 3.8 3.8 4.3 R 4.1 R 4.0 R  Chloride 107 107 103 103 106 R 104 R 104 R  CO2 24 26 27 29 26  R 26 R 26 R  Glucose, Bld 93 121 High  CM 108 High  CM 107 High  91 R, CM 115 R, CM 76 R, CM  Comment: Glucose reference range applies only to samples taken after fasting for at least 8 hours.  BUN 27 High  19 14 14  13.5 R 17.0 R 22.4 R  Creatinine, Ser 1.50 High  1.14 High  1.20 High  1.12 High  1.0 R 1.0 R 1.1 R  Calcium  9.9 9.6 9.1 9.3 9.4 R 9.3 R 9.3 R  GFR, Estimated 33 Low  47 Low  CM 44 Low  CM      Comment:  (NOTE) Calculated using the CKD-EPI Creatinine Equation (2021)  Anion gap 8 7 CM 8 CM 7 CM 8 R 8 R 7 R  Comment: Performed at [facility], [address] [place], [place] [address]  Resulting Agency [facility]        Specimen Collected: 02/13/24 14:00 Last Resulted: 02/13/24 15:21      EKG   Radiology No results found.  Procedures Procedures (including critical care time)  Medications Ordered in UC Medications - No data to display  Initial Impression / Assessment and Plan / UC Course  I have reviewed the triage vital signs and the nursing notes.  Pertinent labs & imaging results that were available during my care of the patient were reviewed by me and considered in my medical decision making (see chart for details).     Reviewed exam and symptoms with patient.  No red flags.  Negative COVID testing.  Discussed viral illness and symptomatic treatment.  Did give provisional prescription for doxycycline with instruction not to take unless her symptoms do not improve or worsen by  November 13 and she verbalized understanding.  Encouraged rest fluids and PCP follow-up 2 to 3 days for recheck.  ER precautions reviewed. Final Clinical Impressions(s) / UC Diagnoses   Final diagnoses:  Acute cough  Viral illness     Discharge Instructions      You tested negative for COVID.  A provisional prescription for doxycycline has been provided.  Please do not take this antibiotic unless your symptoms do not improve or worsen by November 13.  Lots of rest and fluids.  Please follow-up with your PCP in 2 to 3 days for recheck.  Please go to the emergency room for any worsening symptoms.  Hope you feel better soon!     ED Prescriptions     Medication Sig Dispense Auth. Provider   doxycycline (VIBRAMYCIN) 100 MG capsule Take 1 capsule (100 mg total) by mouth 2 (two) times daily for 7 days. 14 capsule Jacqueleen Pulver,  Jodi R, NP      PDMP not reviewed this encounter.   Loreda Myla SAUNDERS, NP 07/27/24 [phone]

## 2024-08-01 ENCOUNTER — Ambulatory Visit

## 2024-08-26 ENCOUNTER — Other Ambulatory Visit: Payer: Self-pay | Admitting: Hematology and Oncology
